# Patient Record
Sex: Male | Born: 1955 | Race: White | Hispanic: No | Marital: Married | Smoking: Never smoker
Health system: Southern US, Community
[De-identification: ages and names within clinical notes are randomized; demographics above are authoritative.]

## PROBLEM LIST (undated history)

## (undated) DIAGNOSIS — Z973 Presence of spectacles and contact lenses: Secondary | ICD-10-CM

## (undated) DIAGNOSIS — K219 Gastro-esophageal reflux disease without esophagitis: Secondary | ICD-10-CM

## (undated) DIAGNOSIS — G8929 Other chronic pain: Secondary | ICD-10-CM

## (undated) DIAGNOSIS — Z8659 Personal history of other mental and behavioral disorders: Secondary | ICD-10-CM

## (undated) DIAGNOSIS — M545 Low back pain, unspecified: Secondary | ICD-10-CM

## (undated) DIAGNOSIS — G43909 Migraine, unspecified, not intractable, without status migrainosus: Secondary | ICD-10-CM

## (undated) DIAGNOSIS — E785 Hyperlipidemia, unspecified: Secondary | ICD-10-CM

## (undated) DIAGNOSIS — Z8619 Personal history of other infectious and parasitic diseases: Secondary | ICD-10-CM

## (undated) HISTORY — DX: Hyperlipidemia, unspecified: E78.5

## (undated) HISTORY — DX: Low back pain, unspecified: M54.50

## (undated) HISTORY — DX: Other chronic pain: G89.29

## (undated) HISTORY — DX: Gastro-esophageal reflux disease without esophagitis: K21.9

## (undated) HISTORY — PX: WISDOM TOOTH EXTRACTION: SHX21

## (undated) HISTORY — PX: BRAIN SURGERY: SHX531

## (undated) HISTORY — DX: Low back pain: M54.5

## (undated) HISTORY — DX: Migraine, unspecified, not intractable, without status migrainosus: G43.909

## (undated) HISTORY — DX: Personal history of other mental and behavioral disorders: Z86.59

## (undated) HISTORY — DX: Personal history of other infectious and parasitic diseases: Z86.19

---
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
## 2012-10-30 ENCOUNTER — Ambulatory Visit (INDEPENDENT_AMBULATORY_CARE_PROVIDER_SITE_OTHER): Payer: 59 | Admitting: Family Medicine

## 2012-10-30 ENCOUNTER — Encounter: Payer: Self-pay | Admitting: Family Medicine

## 2012-10-30 VITALS — BP 130/82 | HR 62 | Temp 98.3°F | Ht 75.0 in | Wt 218.0 lb

## 2012-10-30 DIAGNOSIS — E785 Hyperlipidemia, unspecified: Secondary | ICD-10-CM | POA: Insufficient documentation

## 2012-10-30 DIAGNOSIS — G43909 Migraine, unspecified, not intractable, without status migrainosus: Secondary | ICD-10-CM | POA: Insufficient documentation

## 2012-10-30 DIAGNOSIS — K4090 Unilateral inguinal hernia, without obstruction or gangrene, not specified as recurrent: Secondary | ICD-10-CM

## 2012-10-30 DIAGNOSIS — K219 Gastro-esophageal reflux disease without esophagitis: Secondary | ICD-10-CM | POA: Insufficient documentation

## 2012-10-30 DIAGNOSIS — M545 Low back pain: Secondary | ICD-10-CM

## 2012-10-30 DIAGNOSIS — H8110 Benign paroxysmal vertigo, unspecified ear: Secondary | ICD-10-CM

## 2012-10-30 DIAGNOSIS — G8929 Other chronic pain: Secondary | ICD-10-CM

## 2012-10-30 HISTORY — DX: Unilateral inguinal hernia, without obstruction or gangrene, not specified as recurrent: K40.90

## 2012-10-30 MED ORDER — TRAMADOL HCL 50 MG PO TABS: 50.0000 mg | ORAL_TABLET | Freq: Two times a day (BID) | ORAL | Status: DC | PRN

## 2012-10-30 MED ORDER — MECLIZINE HCL 25 MG PO TABS: 25.0000 mg | ORAL_TABLET | Freq: Two times a day (BID) | ORAL | Status: DC | PRN

## 2012-10-30 NOTE — Assessment & Plan Note (Signed)
Anticipate lumbar strain vs lumbar DDD. Await records of xray from prior PCP. Treat pain with tramadol for now. No red flags today.

## 2012-10-30 NOTE — Assessment & Plan Note (Signed)
Anticipate bilateral BPPV.   Consistent exam today with peripheral cause of vertigo - will treat with home modified epley's and meclizine prn vertigo. To update [place] if not improving with this for referal to vestibular training. Pt agrees with plan. No evidence for central cause today.

## 2012-10-30 NOTE — Assessment & Plan Note (Signed)
Refer to surgery for L inguinal hernia noted today.  Reducible today.

## 2012-10-30 NOTE — Progress Notes (Signed)
Subjective:    Patient ID: Brian Vaughan, male    DOB: 1955-08-21, 57 y.o.   MRN: [id number]  HPI CC: new pt to establish  Prior saw Dr. Dewaine Oats in [place].  DOI 10/23/2012 Works as Psychologist, occupational.  Moving argon bottle - heavy bottle - felt tear in groin - noticed knot that is reducible.  Tender.  Next day was fired from work after he notified them he injured himself.  Already has new job.  Chronic lumbar back pain - thinks from chronic bending over at work.  Has had xrays in past as well as tried taking vicodin 10mg  in past.  Not taking any meds for this.  Worsening over last few weeks.  No shooting pain down legs, paresthesias or weakness of legs, fevers, denies inciting trauma/injury.  Increased dizziness after hot weather week.  Thinks may have overheated.  Not feeling right since.  Sluggish, confused, dizzy.  Mainly vertigo sxs.  Sxs happen with sudden head movements.  Imbalance noted with walking.  No presyncope.  No preceding viral illness.  Denies hearing changes or ringing in ears.  No fevers.  Wants spots on side evaluated.   Lives with wife, no pets Occupation: Psychologist, occupational, Chief Technology Officer at night Edu: HS Activity: no regular exercise  Preventative:  No recent CPE No recent blood work.  Medications and allergies reviewed and updated in chart.  Past histories reviewed and updated if relevant as below. Patient Active Problem List   Diagnosis Date Noted  . Migraine   . HLD (hyperlipidemia)   . GERD (gastroesophageal reflux disease)   . Chronic lumbar pain    Past Medical History  Diagnosis Date  . Chronic lumbar pain   . History of depression   . History of chicken pox   . GERD (gastroesophageal reflux disease)   . HLD (hyperlipidemia)   . Migraine    History reviewed. No pertinent past surgical history. History  Substance Use Topics  . Smoking status: Never Smoker   . Smokeless tobacco: Never Used  . Alcohol Use: Yes     Comment: seldom   Family History   Problem Relation Age of Onset  . Arthritis Mother   . Arthritis Father   . Alcohol abuse Father   . Arthritis Maternal Grandmother   . CAD Maternal Grandmother   . Arthritis Maternal Grandfather   . CAD Maternal Grandfather   . CAD Father 58    smoker, drinker  . Heart disease Mother     CHF  . Stroke Neg Hx   . Cancer Neg Hx   . Diabetes Neg Hx    Allergies  Allergen Reactions  . Codeine Nausea Only   No current outpatient prescriptions on file prior to visit.   No current facility-administered medications on file prior to visit.     Review of Systems  Constitutional: Positive for appetite change. Negative for fever, chills, activity change, fatigue and unexpected weight change.  HENT: Negative for hearing loss and neck pain.   Eyes: Positive for visual disturbance.  Respiratory: Negative for cough, chest tightness and wheezing.   Cardiovascular: Negative for chest pain, palpitations and leg swelling.  Gastrointestinal: Positive for diarrhea. Negative for nausea, vomiting, abdominal pain, constipation, blood in stool and abdominal distention.  Genitourinary: Negative for dysuria, urgency, hematuria and difficulty urinating.  Musculoskeletal: Negative for myalgias and arthralgias.  Skin: Negative for rash.  Neurological: Positive for headaches. Negative for dizziness, seizures and syncope.  Hematological: Negative for adenopathy. Does not bruise/bleed  easily.  Psychiatric/Behavioral: Negative for dysphoric mood. The patient is not nervous/anxious.        Objective:   Physical Exam  Nursing note and vitals reviewed. Constitutional: He is oriented to person, place, and time. He appears well-developed and well-nourished. No distress.  HENT:  Head: Normocephalic and atraumatic.  Right Ear: External ear normal.  Left Ear: External ear normal.  Nose: Nose normal.  Mouth/Throat: Oropharynx is clear and moist. No oropharyngeal exudate.  Eyes: Conjunctivae and EOM are  normal. Pupils are equal, round, and reactive to light. No scleral icterus.  Neck: Normal range of motion. Neck supple. No thyromegaly present.  Cardiovascular: Normal rate, regular rhythm, normal heart sounds and intact distal pulses.   No murmur heard. Pulses:      Radial pulses are 2+ on the right side, and 2+ on the left side.  Pulmonary/Chest: Effort normal and breath sounds normal. No respiratory distress. He has no wheezes. He has no rales.  Abdominal: Soft. Bowel sounds are normal. He exhibits no distension and no mass. There is no tenderness. There is no rebound and no guarding. A hernia is present. Hernia confirmed positive in the left inguinal area (tender but reducible). Hernia confirmed negative in the right inguinal area.  Musculoskeletal: Normal range of motion. He exhibits no edema.  Upper lumbar midline spine tenderness, paraspinous mm tightness L >R Neg SLR bilaterally. + faber bilaterally. Able to heel and toe walk.  Lymphadenopathy:    He has no cervical adenopathy.  Neurological: He is alert and oriented to person, place, and time. He has normal strength. No cranial nerve deficit or sensory deficit.  CN 2-12 intact, station and gait intact dix hallpike positive bilaterally with torsional nystagmus Able to heel and toe walk. 5/5 strength BLE  Skin: Skin is warm and dry. No rash noted.  mjultiple SK and moles L lateral trunk  Psychiatric: He has a normal mood and affect. His behavior is normal. Judgment and thought content normal.       Assessment & Plan:

## 2012-10-30 NOTE — Assessment & Plan Note (Signed)
Check FLP when returns fasting. 

## 2012-10-30 NOTE — Patient Instructions (Signed)
Good to meet you today.  Call us with questions. Pass by Marion's office for referral to surgeon. For vertigo - may use meclizine as needed, and do home exercises provided today. Return at your convenience in next few months fasting for blood work and afterwards for physical.

## 2012-10-30 NOTE — Assessment & Plan Note (Signed)
Stable currently - watching diet. Avoid NSAIDs for now.

## 2012-11-12 ENCOUNTER — Telehealth: Payer: Self-pay

## 2012-11-12 NOTE — Telephone Encounter (Addendum)
I suggest he increase to 2 pills of tramadol up to twice daily for pain as needed.  I will refill if needed.  He has endorsed nausea with vicodin/hydrocodone in the past. Needs to drop off forms for worker's comp. I still have not received records from prior PCP on latest xrays. If persistent pain, I will want him to come in for further eval and xrays.

## 2012-11-12 NOTE — Telephone Encounter (Signed)
Patient notified as instructed by telephone. Patient states that the Tramadol does nothing for him and that he know that there is something stronger out there that he can take. Patient states that he wants to go ahead and have new x-rays and further evaluation done. Appointment scheduled with Dr. Sharen Hones tomorrow (11-13-12).

## 2012-11-12 NOTE — Telephone Encounter (Signed)
Tramadol is not helping pt's back pain;pt has to work and cannot tolerate pain; pain level now is 8-9. Pt request different pain med called to [facility] [address]. Pt has worker's comp paperwork that needs to be filled out re; hernia. Pt has not made appt with surgeon; pt spoke with Shirlee Limerick. Pt request cb when pain med called in.

## 2012-11-13 ENCOUNTER — Ambulatory Visit: Payer: 59 | Admitting: Family Medicine

## 2012-12-11 ENCOUNTER — Encounter: Payer: Self-pay | Admitting: Family Medicine

## 2012-12-21 ENCOUNTER — Ambulatory Visit: Payer: Self-pay | Admitting: Family Medicine

## 2013-02-28 ENCOUNTER — Telehealth: Payer: Self-pay | Admitting: Family Medicine

## 2013-02-28 NOTE — Telephone Encounter (Signed)
Brian Vaughan wanted to let us know that he has hired a Paediatric nurse that will be helping him get his medical bills paid for and we should be getting a request for records soon.  I told him I would note this in his chart.

## 2013-03-26 ENCOUNTER — Telehealth: Payer: Self-pay

## 2013-03-26 DIAGNOSIS — K4090 Unilateral inguinal hernia, without obstruction or gangrene, not specified as recurrent: Secondary | ICD-10-CM

## 2013-03-26 NOTE — Telephone Encounter (Signed)
Consult made with Dr Derrell Lolling on 04/08/13 at 4pm, patient notified. MK

## 2013-03-26 NOTE — Telephone Encounter (Signed)
Pt left v/m requesting appt to be scheduled for hernia surgery in Jan 2015 in [place]. Pt was seen 10/30/12 and Dr Sharen Hones did surgical referral but pt had no insurance and pt is hurting and has insurance now and wants to get surgical referral scheduled.

## 2013-03-26 NOTE — Telephone Encounter (Signed)
Referral placed.

## 2013-04-08 ENCOUNTER — Encounter (INDEPENDENT_AMBULATORY_CARE_PROVIDER_SITE_OTHER): Payer: Self-pay | Admitting: General Surgery

## 2013-04-08 ENCOUNTER — Encounter (INDEPENDENT_AMBULATORY_CARE_PROVIDER_SITE_OTHER): Payer: Self-pay

## 2013-04-08 ENCOUNTER — Ambulatory Visit (INDEPENDENT_AMBULATORY_CARE_PROVIDER_SITE_OTHER): Payer: PRIVATE HEALTH INSURANCE | Admitting: General Surgery

## 2013-04-08 VITALS — BP 118/78 | HR 68 | Temp 97.4°F | Resp 16 | Ht 75.0 in | Wt 220.6 lb

## 2013-04-08 DIAGNOSIS — K4090 Unilateral inguinal hernia, without obstruction or gangrene, not specified as recurrent: Secondary | ICD-10-CM

## 2013-04-08 NOTE — Patient Instructions (Signed)
You have a reducible left inguinal hernia that is causing your pain. We did not find any other hernias elsewhere.  You'll be scheduled for open repair of left inguinal hernia with mesh in the near future at your convenience.  You have been given a prescription for Vicodin for pain. Only take it at night when you are home for the night. Do not take that medication while you are working and do not operate any machinery when you're taking this medication.      Inguinal Hernia, Adult  Care After Refer to this sheet in the next few weeks. These discharge instructions provide you with general information on caring for yourself after you leave the hospital. Your caregiver may also give you specific instructions. Your treatment has been planned according to the most current medical practices available, but unavoidable complications sometimes occur. If you have any problems or questions after discharge, please call your caregiver. HOME CARE INSTRUCTIONS  Put ice on the operative site.  Put ice in a plastic bag.  Place a towel between your skin and the bag.  Leave the ice on for 15-20 minutes at a time, 03-04 times a day while awake.  Change bandages (dressings) as directed.  Keep the wound dry and clean. The wound may be washed gently with soap and water. Gently blot or dab the wound dry. It is okay to take showers 24 to 48 hours after surgery. Do not take baths, use swimming pools, or use hot tubs for 10 days, or as directed by your caregiver.  Only take over-the-counter or prescription medicines for pain, discomfort, or fever as directed by your caregiver.  Continue your normal diet as directed.  Do not lift anything more than 10 pounds or play contact sports for 3 weeks, or as directed. SEEK MEDICAL CARE IF:  There is redness, swelling, or increasing pain in the wound.  There is fluid (pus) coming from the wound.  There is drainage from a wound lasting longer than 1 day.  You have  an oral temperature above 102 F (38.9 C).  You notice a bad smell coming from the wound or dressing.  The wound breaks open after the stitches (sutures) have been removed.  You notice increasing pain in the shoulders (shoulder strap areas).  You develop dizzy episodes or fainting while standing.  You feel sick to your stomach (nauseous) or throw up (vomit). SEEK IMMEDIATE MEDICAL CARE IF:  You develop a rash.  You have difficulty breathing.  You develop a reaction or have side effects to medicines you were given. MAKE SURE YOU:   Understand these instructions.  Will watch your condition.  Will get help right away if you are not doing well or get worse. Document Released: 05/26/2006 Document Revised: 07/18/2011 Document Reviewed: 03/25/2009 [facility] Patient Information 2014 [place], [place]. Inguinal Hernia, Adult Muscles help keep everything in the body in its proper place. But if a weak spot in the muscles develops, something can poke through. That is called a hernia. When this happens in the lower part of the belly (abdomen), it is called an inguinal hernia. (It takes its name from a part of the body in this region called the inguinal canal.) A weak spot in the wall of muscles lets some fat or part of the small intestine bulge through. An inguinal hernia can develop at any age. Men get them more often than women. CAUSES  In adults, an inguinal hernia develops over time.  It can be triggered by:  Suddenly straining the muscles of the lower abdomen.  Lifting heavy objects.  Straining to have a bowel movement. Difficult bowel movements (constipation) can lead to this.  Constant coughing. This may be caused by smoking or lung disease.  Being overweight.  Being pregnant.  Working at a job that requires long periods of standing or heavy lifting.  Having had an inguinal hernia before. One type can be an emergency situation. It is called a strangulated inguinal hernia. It  develops if part of the small intestine slips through the weak spot and cannot get back into the abdomen. The blood supply can be cut off. If that happens, part of the intestine may die. This situation requires emergency surgery. SYMPTOMS  Often, a small inguinal hernia has no symptoms. It is found when a healthcare provider does a physical exam. Larger hernias usually have symptoms.   In adults, symptoms may include:  A lump in the groin. This is easier to see when the person is standing. It might disappear when lying down.  In men, a lump in the scrotum.  Pain or burning in the groin. This occurs especially when lifting, straining or coughing.  A dull ache or feeling of pressure in the groin.  Signs of a strangulated hernia can include:  A bulge in the groin that becomes very painful and tender to the touch.  A bulge that turns red or purple.  Fever, nausea and vomiting.  Inability to have a bowel movement or to pass gas. DIAGNOSIS  To decide if you have an inguinal hernia, a healthcare provider will probably do a physical examination.  This will include asking questions about any symptoms you have noticed.  The healthcare provider might feel the groin area and ask you to cough. If an inguinal hernia is felt, the healthcare provider may try to slide it back into the abdomen.  Usually no other tests are needed. TREATMENT  Treatments can vary. The size of the hernia makes a difference. Options include:  Watchful waiting. This is often suggested if the hernia is small and you have had no symptoms.  No medical procedure will be done unless symptoms develop.  You will need to watch closely for symptoms. If any occur, contact your healthcare provider right away.  Surgery. This is used if the hernia is larger or you have symptoms.  Open surgery. This is usually an outpatient procedure (you will not stay overnight in a hospital). An cut (incision) is made through the skin in the  groin. The hernia is put back inside the abdomen. The weak area in the muscles is then repaired by herniorrhaphy or hernioplasty. Herniorrhaphy: in this type of surgery, the weak muscles are sewn back together. Hernioplasty: a patch or mesh is used to close the weak area in the abdominal wall.  Laparoscopy. In this procedure, a surgeon makes small incisions. A thin tube with a tiny video camera (called a laparoscope) is put into the abdomen. The surgeon repairs the hernia with mesh by looking with the video camera and using two long instruments. HOME CARE INSTRUCTIONS   After surgery to repair an inguinal hernia:  You will need to take pain medicine prescribed by your healthcare provider. Follow all directions carefully.  You will need to take care of the wound from the incision.  Your activity will be restricted for awhile. This will probably include no heavy lifting for several weeks. You also should not do anything too active for a few weeks. When you  can return to work will depend on the type of job that you have.  During "watchful waiting" periods, you should:  Maintain a healthy weight.  Eat a diet high in fiber (fruits, vegetables and whole grains).  Drink plenty of fluids to avoid constipation. This means drinking enough water and other liquids to keep your urine clear or pale yellow.  Do not lift heavy objects.  Do not stand for long periods of time.  Quit smoking. This should keep you from developing a frequent cough. SEEK MEDICAL CARE IF:   A bulge develops in your groin area.  You feel pain, a burning sensation or pressure in the groin. This might be worse if you are lifting or straining.  You develop a fever of more than 100.5 F (38.1 C). SEEK IMMEDIATE MEDICAL CARE IF:   Pain in the groin increases suddenly.  A bulge in the groin gets bigger suddenly and does not go down.  For men, there is sudden pain in the scrotum. Or, the size of the scrotum increases.  A  bulge in the groin area becomes red or purple and is painful to touch.  You have nausea or vomiting that does not go away.  You feel your heart beating much faster than normal.  You cannot have a bowel movement or pass gas.  You develop a fever of more than 102.0 F (38.9 C). Document Released: 09/11/2008 Document Revised: 07/18/2011 Document Reviewed: 09/11/2008 [facility] Patient Information 2014 [place], [place].

## 2013-04-08 NOTE — Progress Notes (Addendum)
Patient ID: Brian Vaughan, male   DOB: 13-Aug-1955, 56 y.o.   MRN: [id number]  Chief Complaint  Patient presents with  . Pain    evaluate inguinal hernia    HPI Brian Vaughan is a 57 y.o. male.  He is referred by Dr. Sharen Hones at [facility] for evaluation of a painful left inguinal hernia.  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 He states that in June of 2014, while at work he developed a pain in his left groin and has had a bulge ever since. He states he reported this to his employer. He states the painful bulge is getting worse and he needs to have this repaired. He also asked for prescription for pain medication.  ADDENDUM(06/04/2013) The patient called and stated that I had made an error in his medical records. He stated that the accident occurred at work in June of 2014, not July of 2013.  At his request, I have ammended my records to reflect the updated history.  Past history is significant for mild reflux symptoms, chronic back pain, the positional vertigo and hyperlipidemia. He has never had a hernia problem in the past.  HPI  Past Medical History  Diagnosis Date  . Chronic lumbar pain     lumbar DDD per xrays  . History of depression   . History of chicken pox   . GERD (gastroesophageal reflux disease)   . HLD (hyperlipidemia)   . Migraine     Past Surgical History  Procedure Laterality Date  . Hernia repair Right 1969    Family History  Problem Relation Age of Onset  . Arthritis Mother   . Arthritis Father   . Alcohol abuse Father   . Arthritis Maternal Grandmother   . CAD Maternal Grandmother   . Arthritis Maternal Grandfather   . CAD Maternal Grandfather   . CAD Father 59    smoker, drinker  . Heart disease Mother     CHF  . Stroke Neg Hx   . Cancer Neg Hx   . Diabetes Neg Hx     Social History History  Substance Use Topics  . Smoking status: Never Smoker   . Smokeless tobacco: Never Used  . Alcohol Use: Yes      Comment: seldom    Allergies  Allergen Reactions  . Codeine Nausea Only  . Vicodin [Hydrocodone-Acetaminophen] Nausea Only    Current Outpatient Prescriptions  Medication Sig Dispense Refill  . Multiple Vitamins-Minerals (MULTIVITAMIN PO) Take 1 tablet by mouth daily.       No current facility-administered medications for this visit.    Review of Systems Review of Systems  Constitutional: Negative for fever, chills and unexpected weight change.  HENT: Negative for congestion, hearing loss, sore throat, trouble swallowing and voice change.   Eyes: Negative for visual disturbance.  Respiratory: Negative for cough and wheezing.   Cardiovascular: Negative for chest pain, palpitations and leg swelling.  Gastrointestinal: Negative for nausea, vomiting, abdominal pain, diarrhea, constipation, blood in stool, abdominal distention, anal bleeding and rectal pain.  Genitourinary: Negative for hematuria and difficulty urinating.  Musculoskeletal: Positive for back pain. Negative for arthralgias.  Skin: Negative for rash and wound.  Neurological: Positive for light-headedness and headaches. Negative for seizures, syncope and weakness.  Hematological: Negative for adenopathy. Does not bruise/bleed easily.  Psychiatric/Behavioral: Negative for confusion.    Blood pressure 118/78, pulse 68, temperature 97.4 F (36.3 C), temperature source Temporal, resp. rate 16, height  6\' 3"  (1.905 m), weight 220 lb 9.6 oz (100.064 kg).  Physical Exam Physical Exam  Constitutional: He is oriented to person, place, and time. He appears well-developed and well-nourished. No distress.  HENT:  Head: Normocephalic.  Nose: Nose normal.  Mouth/Throat: No oropharyngeal exudate.  Eyes: Conjunctivae and EOM are normal. Pupils are equal, round, and reactive to light. Right eye exhibits no discharge. Left eye exhibits no discharge. No scleral icterus.  Neck: Normal range of motion. Neck supple. No JVD present. No  tracheal deviation present. No thyromegaly present.  Cardiovascular: Normal rate, regular rhythm, normal heart sounds and intact distal pulses.   No murmur heard. Pulmonary/Chest: Effort normal and breath sounds normal. No stridor. No respiratory distress. He has no wheezes. He has no rales. He exhibits no tenderness.  Abdominal: Soft. Bowel sounds are normal. He exhibits no distension and no mass. There is no tenderness. There is no rebound and no guarding.  Genitourinary:  Medium-sized left inguinal hernia. Tender. Reducible. No evidence of hernia on the right. Umbilicus normal. No scrotal mass. Testes normal.  Musculoskeletal: Normal range of motion. He exhibits no edema and no tenderness.  Lymphadenopathy:    He has no cervical adenopathy.  Neurological: He is alert and oriented to person, place, and time. He has normal reflexes. Coordination normal.  Skin: Skin is warm and dry. No rash noted. He is not diaphoretic. No erythema. No pallor.  Psychiatric: He has a normal mood and affect. His behavior is normal. Judgment and thought content normal.    Data Reviewed Office notes from Dr. Sharen Hones at [facility].  Assessment    Reducible left inguinal hernia, symptomatic  Migraine headaches  Positional vertigo  GERD  Chronic back pain  Hyperlipidemia     Plan    Scheduled for open repair left inguinal hernia with mesh. This can be done as an outpatient.  I have discussed indications, details, techniques, numerous risk of the surgery with him. He is aware of the risk of bleeding, infection, recurrence, nerve damage, chronic pain, injury to the bladder or intestine or testicle, and other unforeseen problems. He understands all these issues and all his questions were answered. He agrees with this plan.  He is aware that he will be out of work for 4 weeks while while recovering from this surgery.  I gave him a Rx for Vicodin 30 tablets and instructed him to take his only at night  when he is home.        Angelia Mould. Derrell Lolling, M.D., [facility] General and Minimally invasive Surgery Breast and Colorectal Surgery Office:   [phone] Pager:   [phone]  04/08/2013, 4:52 PM

## 2013-04-26 ENCOUNTER — Telehealth (INDEPENDENT_AMBULATORY_CARE_PROVIDER_SITE_OTHER): Payer: Self-pay | Admitting: *Deleted

## 2013-04-26 MED ORDER — IBUPROFEN 800 MG PO TABS: 800.0000 mg | ORAL_TABLET | Freq: Three times a day (TID) | ORAL | Status: DC | PRN

## 2013-04-26 NOTE — Telephone Encounter (Signed)
Patient called today asking for a refill of Vicodin.  Patient states his surgery is not until 05/17/13 left inguinal hernia repair.  Spoke to Dr. Derrell Lolling in office who states that we can send in Ibuprofen 800mg  #30 for the patient but he will not prescribe any more narcotics prior to surgery.  Prescription escribed at this time to [facility] in [place].  Patient updated at this time and states understanding.

## 2013-05-09 HISTORY — PX: HERNIA REPAIR: SHX51

## 2013-05-14 ENCOUNTER — Encounter (HOSPITAL_BASED_OUTPATIENT_CLINIC_OR_DEPARTMENT_OTHER): Payer: Self-pay | Admitting: *Deleted

## 2013-05-14 NOTE — Progress Notes (Signed)
No labs needed

## 2013-05-15 NOTE — H&P (Signed)
Brian Vaughan   MRN:  [id number]   Description: 58 year old male  Provider: Ernestene Mention, MD  Department: [facility]         Diagnoses      Left inguinal hernia    -  Primary      550.90            Current Vitals - Last Recorded      BP Pulse Temp(Src) Resp Ht Wt      118/78 68 97.4 F (36.3 C) (Temporal) 16 6\' 3"  (1.905 m) 220 lb 9.6 oz (100.064 kg)      BMI 27.57 kg/m2                     History and Physical   Ernestene Mention, MD       Status: Signed            Patient ID: Brian Vaughan, male   DOB: Oct 06, 1955, 58 y.o.   MRN: [id number]                HPI Brian Vaughan is a 58 y.o. male.  He is referred by Dr. Sharen Hones at [facility] for evaluation of a painful left inguinal hernia.   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 He states that in July of 2013 while at work he developed a pain in his left groin and has had a bulge ever since. He states he reported this to his employer. He states the painful bulge is getting worse and he needs to have this repaired. He also asked for prescription for pain medication.   Past history is significant for mild reflux symptoms, chronic back pain, the positional vertigo and hyperlipidemia. He has never had a hernia problem in the past.        Past Medical History   Diagnosis  Date   .  Chronic lumbar pain         lumbar DDD per xrays   .  History of depression     .  History of chicken pox     .  GERD (gastroesophageal reflux disease)     .  HLD (hyperlipidemia)     .  Migraine           Past Surgical History   Procedure  Laterality  Date   .  Hernia repair  Right  1969         Family History   Problem  Relation  Age of Onset   .  Arthritis  Mother     .  Arthritis  Father     .  Alcohol abuse  Father     .  Arthritis  Maternal Grandmother     .  CAD  Maternal Grandmother     .  Arthritis  Maternal Grandfather     .  CAD  Maternal Grandfather      .  CAD  Father  88       smoker, drinker   .  Heart disease  Mother         CHF   .  Stroke  Neg Hx     .  Cancer  Neg Hx     .  Diabetes  Neg Hx          Social History History   Substance Use Topics   .  Smoking status:  Never Smoker    .  Smokeless tobacco:  Never Used   .  Alcohol Use:  Yes         Comment: seldom         Allergies   Allergen  Reactions   .  Codeine  Nausea Only   .  Vicodin [Hydrocodone-Acetaminophen]  Nausea Only         Current Outpatient Prescriptions   Medication  Sig  Dispense  Refill   .  Multiple Vitamins-Minerals (MULTIVITAMIN PO)  Take 1 tablet by mouth daily.             No current facility-administered medications for this visit.        Review of Systems   Constitutional: Negative for fever, chills and unexpected weight change.  HENT: Negative for congestion, hearing loss, sore throat, trouble swallowing and voice change.   Eyes: Negative for visual disturbance.  Respiratory: Negative for cough and wheezing.   Cardiovascular: Negative for chest pain, palpitations and leg swelling.  Gastrointestinal: Negative for nausea, vomiting, abdominal pain, diarrhea, constipation, blood in stool, abdominal distention, anal bleeding and rectal pain.  Genitourinary: Negative for hematuria and difficulty urinating.  Musculoskeletal: Positive for back pain. Negative for arthralgias.  Skin: Negative for rash and wound.  Neurological: Positive for light-headedness and headaches. Negative for seizures, syncope and weakness.  Hematological: Negative for adenopathy. Does not bruise/bleed easily.  Psychiatric/Behavioral: Negative for confusion.      Blood pressure 118/78, pulse 68, temperature 97.4 F (36.3 C), temperature source Temporal, resp. rate 16, height 6\' 3"  (1.905 m), weight 220 lb 9.6 oz (100.064 kg).   Physical Exam  Constitutional: He is oriented to person, place, and time. He appears well-developed and well-nourished. No  distress.  HENT:   Head: Normocephalic.   Nose: Nose normal.   Mouth/Throat: No oropharyngeal exudate.  Eyes: Conjunctivae and EOM are normal. Pupils are equal, round, and reactive to light. Right eye exhibits no discharge. Left eye exhibits no discharge. No scleral icterus.  Neck: Normal range of motion. Neck supple. No JVD present. No tracheal deviation present. No thyromegaly present.  Cardiovascular: Normal rate, regular rhythm, normal heart sounds and intact distal pulses.    No murmur heard. Pulmonary/Chest: Effort normal and breath sounds normal. No stridor. No respiratory distress. He has no wheezes. He has no rales. He exhibits no tenderness.  Abdominal: Soft. Bowel sounds are normal. He exhibits no distension and no mass. There is no tenderness. There is no rebound and no guarding.  Genitourinary:  Medium-sized left inguinal hernia. Tender. Reducible. No evidence of hernia on the right. Umbilicus normal. No scrotal mass. Testes normal.  Musculoskeletal: Normal range of motion. He exhibits no edema and no tenderness.  Lymphadenopathy:    He has no cervical adenopathy.  Neurological: He is alert and oriented to person, place, and time. He has normal reflexes. Coordination normal.  Skin: Skin is warm and dry. No rash noted. He is not diaphoretic. No erythema. No pallor.  Psychiatric: He has a normal mood and affect. His behavior is normal. Judgment and thought content normal.      Data Reviewed Office notes from Dr. Sharen Hones at [facility].   Assessment    Reducible left inguinal hernia, symptomatic   Migraine headaches   Positional vertigo   GERD   Chronic back pain   Hyperlipidemia      Plan    Scheduled for open repair left inguinal hernia with mesh. This can be done as an outpatient.   I have discussed  indications, details, techniques, numerous risk of the surgery with him. He is aware of the risk of bleeding, infection, recurrence, nerve damage,  chronic pain, injury to the bladder or intestine or testicle, and other unforeseen problems. He understands all these issues and all his questions were answered. He agrees with this plan.   He is aware that he will be out of work for 4 weeks while while recovering from this surgery.   I gave him a Rx for Vicodin 30 tablets and instructed him to take his only at night when he is home.         Angelia MouldHaywood M. Derrell LollingIngram, M.D., [facility] General and Minimally invasive Surgery Breast and Colorectal Surgery Office:   [phone] Pager:   [phone]

## 2013-05-17 ENCOUNTER — Encounter (HOSPITAL_BASED_OUTPATIENT_CLINIC_OR_DEPARTMENT_OTHER): Payer: Self-pay | Admitting: *Deleted

## 2013-05-17 ENCOUNTER — Encounter (HOSPITAL_BASED_OUTPATIENT_CLINIC_OR_DEPARTMENT_OTHER)
Admission: RE | Disposition: A | Discharge status: Home / Self Care | Payer: Self-pay | Source: Ambulatory Visit | Attending: General Surgery

## 2013-05-17 ENCOUNTER — Ambulatory Visit (HOSPITAL_BASED_OUTPATIENT_CLINIC_OR_DEPARTMENT_OTHER)
Admission: RE | Admit: 2013-05-17 | Discharge: 2013-05-17 | Disposition: A | Discharge status: Home / Self Care | Payer: Self-pay | Source: Ambulatory Visit | Attending: General Surgery | Admitting: General Surgery

## 2013-05-17 ENCOUNTER — Ambulatory Visit (HOSPITAL_BASED_OUTPATIENT_CLINIC_OR_DEPARTMENT_OTHER): Payer: Self-pay | Admitting: Certified Registered"

## 2013-05-17 ENCOUNTER — Encounter (HOSPITAL_BASED_OUTPATIENT_CLINIC_OR_DEPARTMENT_OTHER): Payer: Self-pay | Admitting: Certified Registered"

## 2013-05-17 DIAGNOSIS — K219 Gastro-esophageal reflux disease without esophagitis: Secondary | ICD-10-CM | POA: Insufficient documentation

## 2013-05-17 DIAGNOSIS — K4090 Unilateral inguinal hernia, without obstruction or gangrene, not specified as recurrent: Secondary | ICD-10-CM

## 2013-05-17 HISTORY — DX: Presence of spectacles and contact lenses: Z97.3

## 2013-05-17 HISTORY — PX: INGUINAL HERNIA REPAIR: SHX194

## 2013-05-17 HISTORY — PX: INSERTION OF MESH: SHX5868

## 2013-05-17 LAB — POCT HEMOGLOBIN-HEMACUE: HEMOGLOBIN: 10 g/dL — AB (ref 13.0–17.0)

## 2013-05-17 SURGERY — INSERTION OF MESH
Anesthesia: General | Site: Groin | Laterality: Left

## 2013-05-17 MED ORDER — MIDAZOLAM HCL 2 MG/2ML IJ SOLN
INTRAMUSCULAR | Status: AC
  Filled 2013-05-17: qty 2

## 2013-05-17 MED ORDER — PROPOFOL 10 MG/ML IV BOLUS
INTRAVENOUS | Status: DC | PRN
  Administered 2013-05-17: 100 mg via INTRAVENOUS
  Administered 2013-05-17: 200 mg via INTRAVENOUS

## 2013-05-17 MED ORDER — LIDOCAINE HCL (CARDIAC) 20 MG/ML IV SOLN
INTRAVENOUS | Status: DC | PRN
  Administered 2013-05-17: 60 mg via INTRAVENOUS

## 2013-05-17 MED ORDER — MIDAZOLAM HCL 5 MG/5ML IJ SOLN
INTRAMUSCULAR | Status: DC | PRN
  Administered 2013-05-17: 1 mg via INTRAVENOUS

## 2013-05-17 MED ORDER — CEFAZOLIN SODIUM-DEXTROSE 2-3 GM-% IV SOLR
2.0000 g | INTRAVENOUS | Status: AC
Start: 2013-05-17 — End: 2013-05-17
  Administered 2013-05-17: 2 g via INTRAVENOUS

## 2013-05-17 MED ORDER — HYDROMORPHONE HCL PF 1 MG/ML IJ SOLN
INTRAMUSCULAR | Status: AC
  Filled 2013-05-17: qty 1

## 2013-05-17 MED ORDER — NEOSTIGMINE METHYLSULFATE 1 MG/ML IJ SOLN
INTRAMUSCULAR | Status: DC | PRN
  Administered 2013-05-17: 3.5 mg via INTRAVENOUS

## 2013-05-17 MED ORDER — MEPERIDINE HCL 25 MG/ML IJ SOLN: 6.2500 mg | INTRAMUSCULAR | Status: DC | PRN

## 2013-05-17 MED ORDER — ONDANSETRON HCL 4 MG/2ML IJ SOLN: 4.0000 mg | Freq: Once | INTRAMUSCULAR | Status: DC | PRN

## 2013-05-17 MED ORDER — ROCURONIUM BROMIDE 100 MG/10ML IV SOLN
INTRAVENOUS | Status: DC | PRN
  Administered 2013-05-17: 40 mg via INTRAVENOUS

## 2013-05-17 MED ORDER — MIDAZOLAM HCL 2 MG/2ML IJ SOLN
1.0000 mg | INTRAMUSCULAR | Status: DC | PRN
  Administered 2013-05-17: 2 mg via INTRAVENOUS
  Administered 2013-05-17: 1 mg via INTRAVENOUS

## 2013-05-17 MED ORDER — PROPOFOL 10 MG/ML IV EMUL
INTRAVENOUS | Status: AC
  Filled 2013-05-17: qty 150

## 2013-05-17 MED ORDER — CHLORHEXIDINE GLUCONATE 4 % EX LIQD: 1.0000 "application " | Freq: Once | CUTANEOUS | Status: DC

## 2013-05-17 MED ORDER — OXYCODONE HCL 5 MG PO TABS
5.0000 mg | ORAL_TABLET | Freq: Once | ORAL | Status: AC | PRN
  Administered 2013-05-17: 5 mg via ORAL
  Filled 2013-05-17: qty 1

## 2013-05-17 MED ORDER — FENTANYL CITRATE 0.05 MG/ML IJ SOLN
INTRAMUSCULAR | Status: AC
  Filled 2013-05-17: qty 2

## 2013-05-17 MED ORDER — BUPIVACAINE-EPINEPHRINE PF 0.5-1:200000 % IJ SOLN
INTRAMUSCULAR | Status: AC
  Filled 2013-05-17: qty 30

## 2013-05-17 MED ORDER — LACTATED RINGERS IV SOLN
INTRAVENOUS | Status: DC
  Administered 2013-05-17: 12:00:00 via INTRAVENOUS
  Administered 2013-05-17: 20 mL/h via INTRAVENOUS

## 2013-05-17 MED ORDER — BUPIVACAINE-EPINEPHRINE PF 0.5-1:200000 % IJ SOLN
INTRAMUSCULAR | Status: DC | PRN
  Administered 2013-05-17: 10 mL via PERINEURAL

## 2013-05-17 MED ORDER — FENTANYL CITRATE 0.05 MG/ML IJ SOLN
INTRAMUSCULAR | Status: AC
  Filled 2013-05-17: qty 6

## 2013-05-17 MED ORDER — CEFAZOLIN SODIUM-DEXTROSE 2-3 GM-% IV SOLR
INTRAVENOUS | Status: AC
  Filled 2013-05-17: qty 50

## 2013-05-17 MED ORDER — HYDROMORPHONE HCL PF 1 MG/ML IJ SOLN
0.2500 mg | INTRAMUSCULAR | Status: DC | PRN
  Administered 2013-05-17: 0.25 mg via INTRAVENOUS
  Administered 2013-05-17: 0.5 mg via INTRAVENOUS
  Administered 2013-05-17: 0.25 mg via INTRAVENOUS

## 2013-05-17 MED ORDER — FENTANYL CITRATE 0.05 MG/ML IJ SOLN
50.0000 ug | INTRAMUSCULAR | Status: DC | PRN
  Administered 2013-05-17: 50 ug via INTRAVENOUS
  Administered 2013-05-17: 100 ug via INTRAVENOUS

## 2013-05-17 MED ORDER — DEXAMETHASONE SODIUM PHOSPHATE 4 MG/ML IJ SOLN
INTRAMUSCULAR | Status: DC | PRN
  Administered 2013-05-17: 10 mg via INTRAVENOUS

## 2013-05-17 MED ORDER — ONDANSETRON HCL 4 MG/2ML IJ SOLN
INTRAMUSCULAR | Status: DC | PRN
  Administered 2013-05-17: 4 mg via INTRAVENOUS

## 2013-05-17 MED ORDER — FENTANYL CITRATE 0.05 MG/ML IJ SOLN
INTRAMUSCULAR | Status: DC | PRN
  Administered 2013-05-17 (×2): 50 ug via INTRAVENOUS
  Administered 2013-05-17: 100 ug via INTRAVENOUS

## 2013-05-17 MED ORDER — MIDAZOLAM HCL 2 MG/2ML IJ SOLN
1.0000 mg | Freq: Once | INTRAMUSCULAR | Status: AC
  Administered 2013-05-17: 1 mg via INTRAVENOUS

## 2013-05-17 MED ORDER — OXYCODONE HCL 5 MG/5ML PO SOLN: 5.0000 mg | Freq: Once | ORAL | Status: AC | PRN

## 2013-05-17 MED ORDER — OXYCODONE-ACETAMINOPHEN 7.5-325 MG PO TABS: 1.0000 | ORAL_TABLET | ORAL | Status: DC | PRN

## 2013-05-17 MED ORDER — ROCURONIUM BROMIDE 50 MG/5ML IV SOLN
INTRAVENOUS | Status: AC
  Filled 2013-05-17: qty 1

## 2013-05-17 MED ORDER — GLYCOPYRROLATE 0.2 MG/ML IJ SOLN
INTRAMUSCULAR | Status: DC | PRN
  Administered 2013-05-17: .5 mg via INTRAVENOUS

## 2013-05-17 SURGICAL SUPPLY — 57 items
BENZOIN TINCTURE PRP APPL 2/3 (GAUZE/BANDAGES/DRESSINGS) IMPLANT
BLADE HEX COATED 2.75 (ELECTRODE) ×4 IMPLANT
BLADE SURG 10 STRL SS (BLADE) ×4 IMPLANT
BLADE SURG ROTATE 9660 (MISCELLANEOUS) ×4 IMPLANT
CANISTER SUCT 1200ML W/VALVE (MISCELLANEOUS) ×4 IMPLANT
CHLORAPREP W/TINT 26ML (MISCELLANEOUS) ×4 IMPLANT
CLOSURE WOUND 1/2 X4 (GAUZE/BANDAGES/DRESSINGS)
COVER MAYO STAND STRL (DRAPES) ×4 IMPLANT
COVER TABLE BACK 60X90 (DRAPES) ×4 IMPLANT
DECANTER SPIKE VIAL GLASS SM (MISCELLANEOUS) IMPLANT
DERMABOND ADVANCED (GAUZE/BANDAGES/DRESSINGS) ×2
DERMABOND ADVANCED .7 DNX12 (GAUZE/BANDAGES/DRESSINGS) ×2 IMPLANT
DRAIN PENROSE 1/2X12 LTX STRL (WOUND CARE) ×4 IMPLANT
DRAPE LAPAROTOMY TRNSV 102X78 (DRAPE) IMPLANT
DRAPE PED LAPAROTOMY (DRAPES) ×4 IMPLANT
ELECT REM PT RETURN 9FT ADLT (ELECTROSURGICAL) ×4
ELECTRODE REM PT RTRN 9FT ADLT (ELECTROSURGICAL) ×2 IMPLANT
GLOVE BIO SURGEON STRL SZ7 (GLOVE) ×4 IMPLANT
GLOVE BIOGEL PI IND STRL 7.5 (GLOVE) ×2 IMPLANT
GLOVE BIOGEL PI INDICATOR 7.5 (GLOVE) ×2
GLOVE EUDERMIC 7 POWDERFREE (GLOVE) ×4 IMPLANT
GOWN STRL REUS W/ TWL LRG LVL3 (GOWN DISPOSABLE) ×4 IMPLANT
GOWN STRL REUS W/ TWL XL LVL3 (GOWN DISPOSABLE) ×2 IMPLANT
GOWN STRL REUS W/TWL LRG LVL3 (GOWN DISPOSABLE) ×4
GOWN STRL REUS W/TWL XL LVL3 (GOWN DISPOSABLE) ×2
MESH ULTRAPRO 3X6 7.6X15CM (Mesh General) ×4 IMPLANT
NEEDLE HYPO 25X1 1.5 SAFETY (NEEDLE) ×4 IMPLANT
NS IRRIG 1000ML POUR BTL (IV SOLUTION) ×4 IMPLANT
PACK BASIN DAY SURGERY FS (CUSTOM PROCEDURE TRAY) ×4 IMPLANT
PENCIL BUTTON HOLSTER BLD 10FT (ELECTRODE) ×4 IMPLANT
SLEEVE SCD COMPRESS KNEE MED (MISCELLANEOUS) ×4 IMPLANT
SPONGE GAUZE 4X4 12PLY STER LF (GAUZE/BANDAGES/DRESSINGS) IMPLANT
SPONGE LAP 4X18 X RAY DECT (DISPOSABLE) ×4 IMPLANT
STAPLER VISISTAT 35W (STAPLE) IMPLANT
STRIP CLOSURE SKIN 1/2X4 (GAUZE/BANDAGES/DRESSINGS) IMPLANT
SUT MNCRL AB 4-0 PS2 18 (SUTURE) ×8 IMPLANT
SUT PROLENE 1 CT (SUTURE) IMPLANT
SUT PROLENE 2 0 CT2 30 (SUTURE) ×12 IMPLANT
SUT SILK 2 0 SH (SUTURE) ×4 IMPLANT
SUT SILK 2 0 TIES 17X18 (SUTURE) ×2
SUT SILK 2-0 18XBRD TIE BLK (SUTURE) ×2 IMPLANT
SUT SILK 3 0 SH 30 (SUTURE) IMPLANT
SUT VIC AB 2-0 CT1 27 (SUTURE)
SUT VIC AB 2-0 CT1 TAPERPNT 27 (SUTURE) IMPLANT
SUT VIC AB 2-0 SH 27 (SUTURE) ×4
SUT VIC AB 2-0 SH 27XBRD (SUTURE) ×4 IMPLANT
SUT VIC AB 3-0 54X BRD REEL (SUTURE) IMPLANT
SUT VIC AB 3-0 BRD 54 (SUTURE)
SUT VIC AB 3-0 FS2 27 (SUTURE) IMPLANT
SUT VIC AB 3-0 SH 27 (SUTURE) ×2
SUT VIC AB 3-0 SH 27X BRD (SUTURE) ×2 IMPLANT
SUT VICRYL 3-0 CR8 SH (SUTURE) IMPLANT
SYR CONTROL 10ML LL (SYRINGE) ×4 IMPLANT
TRAY DSU PREP LF (CUSTOM PROCEDURE TRAY) ×4 IMPLANT
TUBE CONNECTING 20'X1/4 (TUBING) ×1
TUBE CONNECTING 20X1/4 (TUBING) ×3 IMPLANT
YANKAUER SUCT BULB TIP NO VENT (SUCTIONS) ×4 IMPLANT

## 2013-05-17 NOTE — Progress Notes (Signed)
Dr. Michelle Piperssey notified that patient drank 2 cups of black coffe at 5a this morning.  Dr. Derrell LollingIngram also notified.

## 2013-05-17 NOTE — Transfer of Care (Signed)
Immediate Anesthesia Transfer of Care Note  Patient: Brian Vaughan  Procedure(s) Performed: Procedure(s): INSERTION OF MESH (Left) HERNIA REPAIR INGUINAL ADULT (Left)  Patient Location: PACU  Anesthesia Type:GA combined with regional for post-op pain  Level of Consciousness: awake, alert  and patient cooperative  Airway & Oxygen Therapy: Patient Spontanous Breathing and Patient connected to face mask oxygen  Post-op Assessment: Report given to PACU RN and Post -op Vital signs reviewed and stable  Post vital signs: Reviewed and stable  Complications: No apparent anesthesia complications

## 2013-05-17 NOTE — Anesthesia Preprocedure Evaluation (Addendum)
Anesthesia Evaluation  Patient identified by MRN, date of birth, ID band Patient awake    Reviewed: Allergy & Precautions, H&P , NPO status , Patient's Chart, lab work & pertinent test results  Airway Mallampati: I TM Distance: >3 FB Neck ROM: Full    Dental   Pulmonary          Cardiovascular     Neuro/Psych  Headaches,    GI/Hepatic   Endo/Other    Renal/GU      Musculoskeletal   Abdominal   Peds  Hematology   Anesthesia Other Findings   Reproductive/Obstetrics                          Anesthesia Physical Anesthesia Plan  ASA: II  Anesthesia Plan: General   Post-op Pain Management:    Induction: Intravenous  Airway Management Planned: LMA  Additional Equipment:   Intra-op Plan:   Post-operative Plan: Extubation in OR  Informed Consent: I have reviewed the patients History and Physical, chart, labs and discussed the procedure including the risks, benefits and alternatives for the proposed anesthesia with the patient or authorized representative who has indicated his/her understanding and acceptance.     Plan Discussed with: CRNA and Surgeon  Anesthesia Plan Comments: (Pt drank two large cups of black coffee at 5 am. Will need to wait 4hrs. Discussed with patient and Dr Derrell LollingIngram.)        Anesthesia Quick Evaluation

## 2013-05-17 NOTE — Discharge Instructions (Signed)
Post Anesthesia Home Care Instructions  Activity: Get plenty of rest for the remainder of the day. A responsible adult should stay with you for 24 hours following the procedure.  For the next 24 hours, DO NOT: -Drive a car -Advertising copywriterperate machinery -Drink alcoholic beverages -Take any medication unless instructed by your physician -Make any legal decisions or sign important papers.  Meals: Start with liquid foods such as gelatin or soup. Progress to regular foods as tolerated. Avoid greasy, spicy, heavy foods. If nausea and/or vomiting occur, drink only clear liquids until the nausea and/or vomiting subsides. Call your physician if vomiting continues.  Special Instructions/Symptoms: Your throat may feel dry or sore from the anesthesia or the breathing tube placed in your throat during surgery. If this causes discomfort, gargle with warm salt water. The discomfort should disappear within 24 hours.      Do not lift anything greater than 20 pounds and do not play sports for 1 complete month.  Be sure to walk daily  Take a laxative or stool softener to counteract constipation from the pain medicine  You may drive your car in one week.  You may shower starting tomorrow.       [facility]  UMBILICAL OR INGUINAL HERNIA REPAIR: POST OP INSTRUCTIONS  Always review your discharge instruction sheet given to you by the facility where your surgery was performed. IF YOU HAVE DISABILITY OR FAMILY LEAVE FORMS, YOU MUST BRING THEM TO THE OFFICE FOR PROCESSING.   DO NOT GIVE THEM TO YOUR DOCTOR.  1. A  prescription for pain medication may be given to you upon discharge.  Take your pain medication as prescribed, if needed.  If narcotic pain medicine is not needed, then you may take acetaminophen (Tylenol) or ibuprofen (Advil) as needed. 2. Take your usually prescribed medications unless otherwise directed. 3. If you need a refill on your pain medication, please  contact your pharmacy.  They will contact our office to request authorization. Prescriptions will not be filled after 5 pm or on week-ends. 4. You should follow a light diet the first 24 hours after arrival home, such as soup and crackers, etc.  Be sure to include lots of fluids daily.  Resume your normal diet the day after surgery. 5. Most patients will experience some swelling and bruising around the umbilicus or in the groin and scrotum.  Ice packs and reclining will help.  Swelling and bruising can take several days to resolve.  6. It is common to experience some constipation if taking pain medication after surgery.  Increasing fluid intake and taking a stool softener (such as Colace) will usually help or prevent this problem from occurring.  A mild laxative (Milk of Magnesia or Miralax) should be taken according to package directions if there are no bowel movements after 48 hours. 7. Unless discharge instructions indicate otherwise, you may remove your bandages 24-48 hours after surgery, and you may shower at that time.  You may have steri-strips (small skin tapes) in place directly over the incision.  These strips should be left on the skin for 7-10 days.  If your surgeon used skin glue on the incision, you may shower in 24 hours.  The glue will flake off over the next 2-3 weeks.  Any sutures or staples will be removed at the office during your follow-up visit. 8. ACTIVITIES:  You may resume regular (light) daily activities beginning the next day--such as daily self-care, walking, climbing stairs--gradually increasing activities as tolerated.  You may have sexual intercourse when it is comfortable.  Refrain from any heavy lifting or straining until approved by your doctor. a. You may drive when you are no longer taking prescription pain medication, you can comfortably wear a seatbelt, and you can safely maneuver your car and apply brakes. b. RETURN TO WORK:   __________________________________________________________ 9. You should see your doctor in the office for a follow-up appointment approximately 2-3 weeks after your surgery.  Make sure that you call for this appointment within a day or two after you arrive home to insure a convenient appointment time. 10. OTHER INSTRUCTIONS:  __________________________________________________________________________________________________________________________________________________________________________________________  WHEN TO CALL YOUR DOCTOR: 1. Fever over 101.0 2. Inability to urinate 3. Nausea and/or vomiting 4. Extreme swelling or bruising 5. Continued bleeding from incision. 6. Increased pain, redness, or drainage from the incision  The clinic staff is available to answer your questions during regular business hours.  Please dont hesitate to call and ask to speak to one of the nurses for clinical concerns.  If you have a medical emergency, go to the nearest emergency room or call 911.  A surgeon from [facility] is always on call at the hospital   [address], [address], [place], [place]  [address] ?  [address] [address], [place], [place]   [address] [phone] ? [phone] ? FAX [phone] Web site: [email or web address]

## 2013-05-17 NOTE — Interval H&P Note (Signed)
History and Physical Interval Note:  05/17/2013 7:59 AM  Brian Vaughan  has presented today for surgery, with the diagnosis of left inguinal hernia   The goals and the various methods of treatment have been discussed with the patient and family. After consideration of risks, benefits and other options for treatment, the patient has consented to  Procedure(s): LAPAROSCOPIC INGUINAL HERNIA (Left) INSERTION OF MESH (Left) as a surgical intervention .  The patient's history has been reviewed, patient examined today, no change in status, stable for surgery.  I have reviewed the patient's chart and labs.  Questions were answered to the patient's satisfaction.     Ernestene MentionINGRAM,Delylah Stanczyk M

## 2013-05-17 NOTE — Op Note (Addendum)
Patient Name:           Brian Vaughan   Date of Surgery:        05/17/2013  Pre op Diagnosis:      Left inguinal hernia  Post op Diagnosis:    Same  Procedure:                 Open repair of left inguinal hernia with mesh  Surgeon:                     Angelia MouldHaywood M. Derrell LollingIngram, M.D., FACS  Assistant:                      None  Operative Indications:   Brian Vaughan is a 58 y.o. male. He is referred by Dr. Sharen HonesGutierrez at [facility] for evaluation of a painful left inguinal hernia.  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 He states that in June of 2014, while at work he developed a pain in his left groin and has had a bulge ever since. He states he reported this to his employer. He states the painful bulge is getting worse and he needs to have this repaired. Examination reveals a medium size but reducible left inguinal hernia.. He has never had a hernia problem in the past   Operative Findings:       The patient had a medium-sized direct left inguinal hernia. He did not have an indirect hernia sac.  Procedure in Detail:          Following the induction of general endotracheal anesthesia the patient's abdomen and genitalia were prepped and draped in a sterile fashion. Surgical time out performed. Intravenous antibiotics were given. 0.5 to Marcaine with epinephrine was used as a local infiltration anesthetic. Dr. Michelle Piperssey had performed a TAPP Block preoperatively.    A transverse incision was made in the left groin, overlying the inguinal canal. Dissection was carried down to the inguinal soft tissue exposing the aponeurosis of the external oblique. The external oblique was incised in the direction of its fibers opening up the external inguinal ring. Self-retaining retractors were placed. Sensory nerves that were associated with the cord structures were traced back to their origin from the  muscles laterally, clamped, divided, and ligated with 2-0 silk ties. The redundant  nerve medially was resected. The cord structures were mobilized and encircled with a Penrose drain. Cremasteric muscle fibers were skeletonized. He did not have an indirect sac. He did have a large direct hernia which was dissected away from the cord structures. Once I had this completely cleaned off I simply reduced it and then oversewed the tissues with a running suture of 2-0 Vicryl. The floor of the inguinal canal was repaired and reinforced with an onlay graft of ultra Pro mesh. A 3" x 6" piece of ultra Pro mesh was brought to the operative field and trimmed at the corners to accommodate the anatomy of the wound. The mesh was sutured so as to generously overlap the pubic tubercle, then along the inguinal ligament inferiorly. Medially, superiorly, and superiolaterally several mattress sutures of 2-0 Prolene were placed to secure the mesh. The mesh was incised laterally so as to wrap around the cord structures at the internal ring. The tails of the mesh were overlapped laterally and some Prolene sutures were placed laterally. This provided very secure repair both medial and lateral to the internal ring but allowed  adequate fingertip opening for the cord structures. There was excellent hemostasis. We was irrigated with saline. The external oblique was closed with a running suture of 2-0 Vicryl. Scarpa's fascia was closed with 3-0 Vicryl and  the skin closed with running subcuticular suture of 4-0 Monocryl and Dermabond. The patient tolerated the procedure well was taken to PACU in stable condition. EBL 10 cc. Counts correct. Complications none.     Angelia Mould. Derrell Lolling, M.D., FACS General and Minimally Invasive Surgery Breast and Colorectal Surgery  05/17/2013 12:40 PM

## 2013-05-17 NOTE — Anesthesia Postprocedure Evaluation (Signed)
Anesthesia Post Note  Patient: Brian Vaughan  Procedure(s) Performed: Procedure(s) (LRB): INSERTION OF MESH (Left) HERNIA REPAIR INGUINAL ADULT (Left)  Anesthesia type: general  Patient location: PACU  Post pain: Pain level controlled  Post assessment: Patient's Cardiovascular Status Stable  Last Vitals:  Filed Vitals:   05/17/13 1413  BP: 157/92  Pulse: 66  Temp: 36.6 C  Resp: 16    Post vital signs: Reviewed and stable  Level of consciousness: sedated  Complications: No apparent anesthesia complications

## 2013-05-17 NOTE — Progress Notes (Signed)
Assisted Dr. Ossey with left, ultrasound guided, transabdominal plane block. Side rails up, monitors on throughout procedure. See vital signs in flow sheet. Tolerated Procedure well. 

## 2013-05-20 ENCOUNTER — Encounter (HOSPITAL_BASED_OUTPATIENT_CLINIC_OR_DEPARTMENT_OTHER): Payer: Self-pay | Admitting: General Surgery

## 2013-05-28 ENCOUNTER — Telehealth (INDEPENDENT_AMBULATORY_CARE_PROVIDER_SITE_OTHER): Payer: Self-pay | Admitting: General Surgery

## 2013-05-28 NOTE — Telephone Encounter (Signed)
Pt's wife called to request refill of pain meds.  Pt has LIH repaired on 05/17/13, is progressing well, but still using some pain medicine.  Per Protocol, Norco 5/325 mg, # 30 (thirty,) 1 po Q4-6H prn pain, no refill issued and signed by Dr. Donell BeersByerly (for Dr. Derrell LollingIngram.)  Rx placed at the front desk for pick-up.

## 2013-06-13 ENCOUNTER — Encounter (INDEPENDENT_AMBULATORY_CARE_PROVIDER_SITE_OTHER): Payer: Self-pay | Admitting: General Surgery

## 2013-06-13 ENCOUNTER — Encounter (INDEPENDENT_AMBULATORY_CARE_PROVIDER_SITE_OTHER): Payer: Self-pay

## 2013-06-13 ENCOUNTER — Ambulatory Visit (INDEPENDENT_AMBULATORY_CARE_PROVIDER_SITE_OTHER): Payer: PRIVATE HEALTH INSURANCE | Admitting: General Surgery

## 2013-06-13 VITALS — BP 142/78 | HR 72 | Temp 97.5°F | Resp 18 | Ht 75.0 in | Wt 214.0 lb

## 2013-06-13 DIAGNOSIS — K4090 Unilateral inguinal hernia, without obstruction or gangrene, not specified as recurrent: Secondary | ICD-10-CM

## 2013-06-13 NOTE — Progress Notes (Signed)
Patient ID: Brian MountMilton Vaughan, male   DOB: Oct 13, 1955, 58 y.o.   MRN: [id number] History: This patient underwent open repair of left inguinal hernia with mesh on 05/17/2013. He is recovering uneventfully. Pain is subsiding. No neuropathic pain. No hematoma or ecchymoses reported  Exam:  Patient looks well. In no distress Left inguinal incision is healing normally. Minimal swelling. No hematoma. No infection. Repair appears intact. Penis scrotum and testes are normal  Assessment: Left inguinal hernia, recovering uneventfully following open repair with mesh  Plan: Return to work without restrictions February 9 Return to see me as needed.   Brian Vaughan, M.D., [facility] General and Minimally invasive Surgery Breast and Colorectal Surgery Office:   [phone] Pager:   [phone]

## 2013-06-13 NOTE — Patient Instructions (Signed)
You are recovering from your left inguinal hernia repair with mesh without any obvious surgical complications.  He may return to work Monday,Feb. 9,  without any restrictions.  Return to see Dr. Derrell LollingIngram if further problems arise

## 2018-04-28 ENCOUNTER — Other Ambulatory Visit: Payer: Self-pay

## 2018-04-28 ENCOUNTER — Emergency Department: Payer: Self-pay

## 2018-04-28 ENCOUNTER — Emergency Department
Admission: EM | Admit: 2018-04-28 | Discharge: 2018-04-28 | Disposition: A | Discharge status: Home / Self Care | Payer: Self-pay | Attending: Emergency Medicine | Admitting: Emergency Medicine

## 2018-04-28 ENCOUNTER — Encounter: Payer: Self-pay | Admitting: *Deleted

## 2018-04-28 DIAGNOSIS — Y9389 Activity, other specified: Secondary | ICD-10-CM | POA: Insufficient documentation

## 2018-04-28 DIAGNOSIS — Y9269 Other specified industrial and construction area as the place of occurrence of the external cause: Secondary | ICD-10-CM | POA: Insufficient documentation

## 2018-04-28 DIAGNOSIS — M5431 Sciatica, right side: Secondary | ICD-10-CM | POA: Insufficient documentation

## 2018-04-28 DIAGNOSIS — X500XXA Overexertion from strenuous movement or load, initial encounter: Secondary | ICD-10-CM | POA: Insufficient documentation

## 2018-04-28 DIAGNOSIS — S8011XA Contusion of right lower leg, initial encounter: Secondary | ICD-10-CM | POA: Insufficient documentation

## 2018-04-28 DIAGNOSIS — Y990 Civilian activity done for income or pay: Secondary | ICD-10-CM | POA: Insufficient documentation

## 2018-04-28 DIAGNOSIS — Z79899 Other long term (current) drug therapy: Secondary | ICD-10-CM | POA: Insufficient documentation

## 2018-04-28 DIAGNOSIS — M79604 Pain in right leg: Secondary | ICD-10-CM | POA: Insufficient documentation

## 2018-04-28 NOTE — ED Notes (Signed)
Patient transported to X-ray 

## 2018-04-28 NOTE — Discharge Instructions (Addendum)
Return to the ER for new, worsening, persistent severe pain, any difficulty walking or bearing weight, weakness or numbness, coldness or decrease in circulation, or any other new or worsening symptoms that concern you.

## 2018-04-28 NOTE — ED Provider Notes (Signed)
[facility] Emergency Department Provider Note ____________________________________________   First MD Initiated Contact with Patient 04/28/18 1720     (approximate)  I have reviewed the triage vital signs and the nursing notes.   HISTORY  Chief Complaint Back Pain    HPI Glendell Fouse is a 62 y.o. male with PMH as noted below who presents with a right lower leg pain, acute onset after the patient twisted his knee while carrying a heavy tool kit last month.  The patient states that it swelled and was bruised and discolored and has been bothering him since.  He states he is concerned that he broke the leg.  The patient states that a few weeks ago he also had a fall onto his lower back and since then has had right lower back pain radiating to the leg with some intermittent tingling.  He denies numbness or weakness.  He states that he has woken up a few times with the right leg being cold and painful but this resolves quickly.  He denies any pain with walking or any type of claudication.  Past Medical History:  Diagnosis Date  . Chronic lumbar pain    lumbar DDD per xrays  . GERD (gastroesophageal reflux disease)   . History of chicken pox   . History of depression   . HLD (hyperlipidemia)   . Migraine   . Wears glasses     Patient Active Problem List   Diagnosis Date Noted  . Left inguinal hernia 10/30/2012  . BPPV (benign paroxysmal positional vertigo) 10/30/2012  . Migraine   . HLD (hyperlipidemia)   . GERD (gastroesophageal reflux disease)   . Chronic lumbar pain     Past Surgical History:  Procedure Laterality Date  . BRAIN SURGERY    . HERNIA REPAIR Right 1969  . HERNIA REPAIR Left 2015   direct with mesh Dalbert Batman)  . INGUINAL HERNIA REPAIR Left 05/17/2013   Procedure: HERNIA REPAIR INGUINAL ADULT;  Surgeon: Adin Hector, MD;  Location: [place];  Service: General;  Laterality: Left;  . INSERTION OF MESH Left 05/17/2013     Procedure: INSERTION OF MESH;  Surgeon: Adin Hector, MD;  Location: [place];  Service: General;  Laterality: Left;  . WISDOM TOOTH EXTRACTION      Prior to Admission medications   Medication Sig Start Date End Date Taking? Authorizing Provider  ibuprofen (ADVIL,MOTRIN) 800 MG tablet Take 1 tablet (800 mg total) by mouth every 8 (eight) hours as needed. 04/26/13   Fanny Skates, MD  Multiple Vitamins-Minerals (MULTIVITAMIN PO) Take 1 tablet by mouth daily.    [provider]    Allergies Codeine and Vicodin [hydrocodone-acetaminophen]  Family History  Problem Relation Age of Onset  . Arthritis Mother   . Heart disease Mother        CHF  . Arthritis Father   . Alcohol abuse Father   . CAD Father 49       smoker, drinker  . Arthritis Maternal Grandmother   . CAD Maternal Grandmother   . Arthritis Maternal Grandfather   . CAD Maternal Grandfather   . Stroke Neg Hx   . Cancer Neg Hx   . Diabetes Neg Hx     Social History Social History   Tobacco Use  . Smoking status: Never Smoker  . Smokeless tobacco: Never Used  Substance Use Topics  . Alcohol use: Yes    Comment: seldom  . Drug use:  No    Review of Systems  Constitutional: No fever. Eyes: No redness. ENT: No neck pain. Cardiovascular: Denies chest pain. Respiratory: Denies shortness of breath. Gastrointestinal: No abdominal pain.  Genitourinary: Negative for flank pain.  Musculoskeletal: Positive for for back pain. Skin: Negative for rash. Neurological: Negative for focal weakness or numbness.   ____________________________________________   PHYSICAL EXAM:  VITAL SIGNS: ED Triage Vitals  Enc Vitals Group     BP 04/28/18 1702 127/77     Pulse Rate 04/28/18 1702 65     Resp 04/28/18 1702 16     Temp 04/28/18 1702 98.2 F (36.8 C)     Temp Source 04/28/18 1702 Oral     SpO2 04/28/18 1702 96 %     Weight 04/28/18 1703 215 lb (97.5 kg)     Height 04/28/18 1703 6'  3" (1.905 m)     Head Circumference --      Peak Flow --      Pain Score 04/28/18 1702 8     Pain Loc --      Pain Edu? --      Excl. in Home? --     Constitutional: Alert and oriented. Well appearing and in no acute distress. Eyes: Conjunctivae are normal.  Head: Atraumatic. Nose: No congestion/rhinnorhea. Mouth/Throat: Mucous membranes are moist.   Neck: Normal range of motion.  Cardiovascular:  Good peripheral circulation. Respiratory: Normal respiratory effort.  Gastrointestinal:  No distention.  Musculoskeletal: Extremities warm and well perfused.  Right proximal tibia with mild tenderness but no deformity and no step-off.  FROM of right knee with no effusion or bony tenderness.  2+ DP pulses bilaterally.  Normal coloration to bilateral lower extremities.  No midline spinal tenderness.  Mild right lumbar paraspinal muscle tenderness.  Negative straight leg raise. Neurologic:  Normal speech and language. 5/5 motor strength and intact sensation of bilateral lower extremities. Skin:  Skin is warm and dry. No rash noted. Psychiatric: Mood and affect are normal. Speech and behavior are normal.  ____________________________________________   LABS (all labs ordered are listed, but only abnormal results are displayed)  Labs Reviewed - No data to display ____________________________________________  EKG   ____________________________________________  RADIOLOGY  XR R tib/fib: No acute fracture XR lumbar: No acute fracture  ____________________________________________   PROCEDURES  Procedure(s) performed: No  Procedures  Critical Care performed: No ____________________________________________   INITIAL IMPRESSION / ASSESSMENT AND PLAN / ED COURSE  Pertinent labs & imaging results that were available during my care of the patient were reviewed by me and considered in my medical decision making (see chart for details).  62 year old male with PMH as noted above  presents with right lower leg pain after twisting it last month, concerned that he may have fractured it but did not know.  He has been bearing weight without much difficulty.  He also reports right lower back pain radiating to the leg with some tingling occurring over the last few weeks after a fall onto his lower back.  On exam the patient is well-appearing and his vital signs are normal.  He has a normal neuro exam of the lower extremities and no midline spinal tenderness.  He has mild tenderness to the right proximal tibia but no deformity and no ecchymosis or other acute findings.  He has good distal pulses and circulation of bilateral lower extremities.  Overall I suspect that the patient had a tibial contusion and possibly a knee effusion that have resolved.  He also  likely has some mild right-sided sciatica.  There is no evidence of arterial or venous insufficiency.  He has no edema or swelling, and there is no clinical evidence to support DVT.  Given the lack of neuro deficits he does not require advanced imaging.  I will obtain an x-ray of the lower spine to rule out compression fracture or other acute abnormality, and x-ray of the right tibia.  ----------------------------------------- 7:34 PM on 04/28/2018 -----------------------------------------  X-rays are negative.  The patient is stable for discharge home.  I counseled him on the results of the imaging.  Return precautions given, and he expresses understanding. ____________________________________________   FINAL CLINICAL IMPRESSION(S) / ED DIAGNOSES  Final diagnoses:  Contusion of right tibia  Sciatica of right side      NEW MEDICATIONS STARTED DURING THIS VISIT:  New Prescriptions   No medications on file     Note:  This document was prepared using Dragon voice recognition software and may include unintentional dictation errors.    Arta Silence, MD 04/28/18 Joen Laura
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
## 2019-08-05 ENCOUNTER — Other Ambulatory Visit: Payer: Self-pay

## 2019-08-05 ENCOUNTER — Emergency Department
Admission: EM | Admit: 2019-08-05 | Discharge: 2019-08-06 | Disposition: A | Discharge status: Other Acute Inpatient Hospital | Payer: Self-pay | Attending: Emergency Medicine | Admitting: Emergency Medicine

## 2019-08-05 ENCOUNTER — Emergency Department: Payer: Self-pay

## 2019-08-05 DIAGNOSIS — Z20822 Contact with and (suspected) exposure to covid-19: Secondary | ICD-10-CM | POA: Insufficient documentation

## 2019-08-05 DIAGNOSIS — Y9259 Other trade areas as the place of occurrence of the external cause: Secondary | ICD-10-CM | POA: Insufficient documentation

## 2019-08-05 DIAGNOSIS — Z79899 Other long term (current) drug therapy: Secondary | ICD-10-CM | POA: Insufficient documentation

## 2019-08-05 DIAGNOSIS — Y9389 Activity, other specified: Secondary | ICD-10-CM | POA: Insufficient documentation

## 2019-08-05 DIAGNOSIS — Y990 Civilian activity done for income or pay: Secondary | ICD-10-CM | POA: Insufficient documentation

## 2019-08-05 DIAGNOSIS — Z23 Encounter for immunization: Secondary | ICD-10-CM | POA: Insufficient documentation

## 2019-08-05 DIAGNOSIS — S61442A Puncture wound with foreign body of left hand, initial encounter: Secondary | ICD-10-CM | POA: Insufficient documentation

## 2019-08-05 DIAGNOSIS — S61432A Puncture wound without foreign body of left hand, initial encounter: Secondary | ICD-10-CM

## 2019-08-05 DIAGNOSIS — W458XXA Other foreign body or object entering through skin, initial encounter: Secondary | ICD-10-CM | POA: Insufficient documentation

## 2019-08-05 DIAGNOSIS — L089 Local infection of the skin and subcutaneous tissue, unspecified: Secondary | ICD-10-CM

## 2019-08-05 NOTE — ED Notes (Signed)
Pt requests not to have blood work completed at this time d/t not having insurance and trying to save on visit costs.
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
## 2019-08-06 LAB — CBC WITH DIFFERENTIAL/PLATELET
Abs Immature Granulocytes: 0.02 10*3/uL (ref 0.00–0.07)
Basophils Absolute: 0.1 10*3/uL (ref 0.0–0.1)
Basophils Relative: 1 %
Eosinophils Absolute: 0.3 10*3/uL (ref 0.0–0.5)
Eosinophils Relative: 4 %
HCT: 41.9 % (ref 39.0–52.0)
Hemoglobin: 13.7 g/dL (ref 13.0–17.0)
Immature Granulocytes: 0 %
Lymphocytes Relative: 37 %
Lymphs Abs: 2.3 10*3/uL (ref 0.7–4.0)
MCH: 30.2 pg (ref 26.0–34.0)
MCHC: 32.7 g/dL (ref 30.0–36.0)
MCV: 92.5 fL (ref 80.0–100.0)
Monocytes Absolute: 0.5 10*3/uL (ref 0.1–1.0)
Monocytes Relative: 8 %
Neutro Abs: 3.2 10*3/uL (ref 1.7–7.7)
Neutrophils Relative %: 50 %
Platelets: 250 10*3/uL (ref 150–400)
RBC: 4.53 MIL/uL (ref 4.22–5.81)
RDW: 12.5 % (ref 11.5–15.5)
WBC: 6.3 10*3/uL (ref 4.0–10.5)
nRBC: 0 % (ref 0.0–0.2)

## 2019-08-06 LAB — RESPIRATORY PANEL BY RT PCR (FLU A&B, COVID)
Influenza A by PCR: NEGATIVE
Influenza B by PCR: NEGATIVE
SARS Coronavirus 2 by RT PCR: NEGATIVE

## 2019-08-06 LAB — BASIC METABOLIC PANEL
Anion gap: 10 (ref 5–15)
BUN: 29 mg/dL — ABNORMAL HIGH (ref 8–23)
CO2: 29 mmol/L (ref 22–32)
Calcium: 9.1 mg/dL (ref 8.9–10.3)
Chloride: 99 mmol/L (ref 98–111)
Creatinine, Ser: 1.31 mg/dL — ABNORMAL HIGH (ref 0.61–1.24)
GFR calc Af Amer: >60 mL/min (ref >60)
GFR calc non Af Amer: 58 mL/min — ABNORMAL LOW (ref >60)
Glucose, Bld: 105 mg/dL — ABNORMAL HIGH (ref 70–99)
Potassium: 3.9 mmol/L (ref 3.5–5.1)
Sodium: 138 mmol/L (ref 135–145)

## 2019-08-06 MED ORDER — LORAZEPAM 2 MG/ML IJ SOLN
1.0000 mg | Freq: Once | INTRAMUSCULAR | Status: AC
  Administered 2019-08-06: 01:00:00 1 mg via INTRAVENOUS
  Filled 2019-08-06: qty 1

## 2019-08-06 MED ORDER — PIPERACILLIN-TAZOBACTAM 3.375 G IVPB 30 MIN
3.3750 g | Freq: Once | INTRAVENOUS | Status: AC
Start: 2019-08-06 — End: 2019-08-06
  Administered 2019-08-06: 3.375 g via INTRAVENOUS
  Filled 2019-08-06: qty 50

## 2019-08-06 MED ORDER — TETANUS-DIPHTH-ACELL PERTUSSIS 5-2.5-18.5 LF-MCG/0.5 IM SUSP
0.5000 mL | Freq: Once | INTRAMUSCULAR | Status: AC
  Administered 2019-08-06: 01:00:00 0.5 mL via INTRAMUSCULAR
  Filled 2019-08-06: qty 0.5

## 2019-08-06 NOTE — Discharge Instructions (Signed)
AS WE DISCUSSED, GO TO THE ER AT [facility] AT [address], [place]  TELL THEM YOU ARE HERE FOR A MEDICAL TRANSFER FROM [facility] - THEY SHOULD LET YOU BACK   THE HAND SURGERY TEAM AND ER DOCTOR SHOULD BE AWARE THAT YOU ARE COMING  GO DIRECTLY TO THE ER.   TRYING TO PUT THIS OFF OR TAKE ANTIBIOTICS ONLY COULD LEAD TO EVENTUAL WORSENING INFECTION, PAIN, AND LOSS OF YOUR HAND
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
## 2020-01-24 IMAGING — CR DG LUMBAR SPINE 2-3V
1 series · 3 of 3 positions shown · non-contrast
Comparison: None.

CLINICAL DATA: Fall with sciatica pain

EXAM:
LUMBAR SPINE - 2-3 VIEW

[Series 1: dg lumbar spine 2-3 views · 0.14mm/px · 3 of 3 slices shown]
[im 1/3]
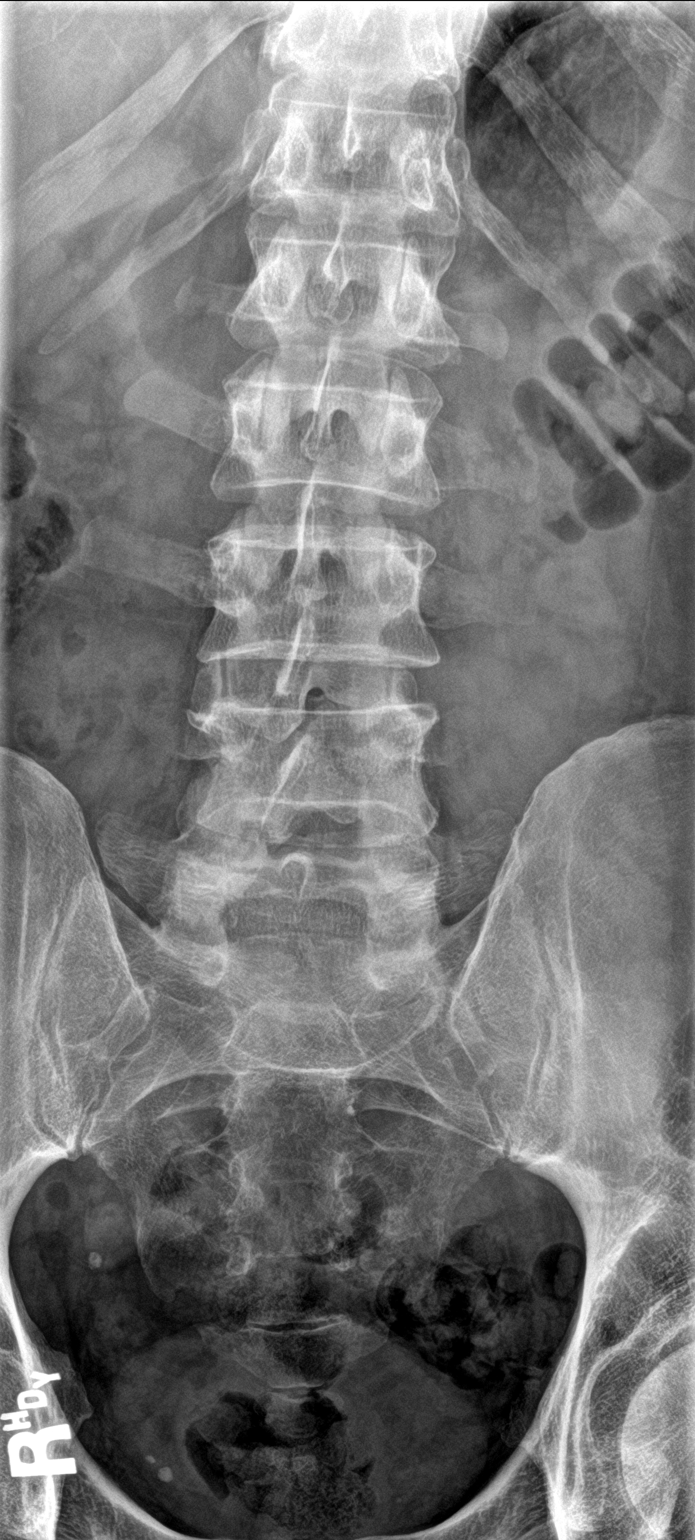
[im 2/3]
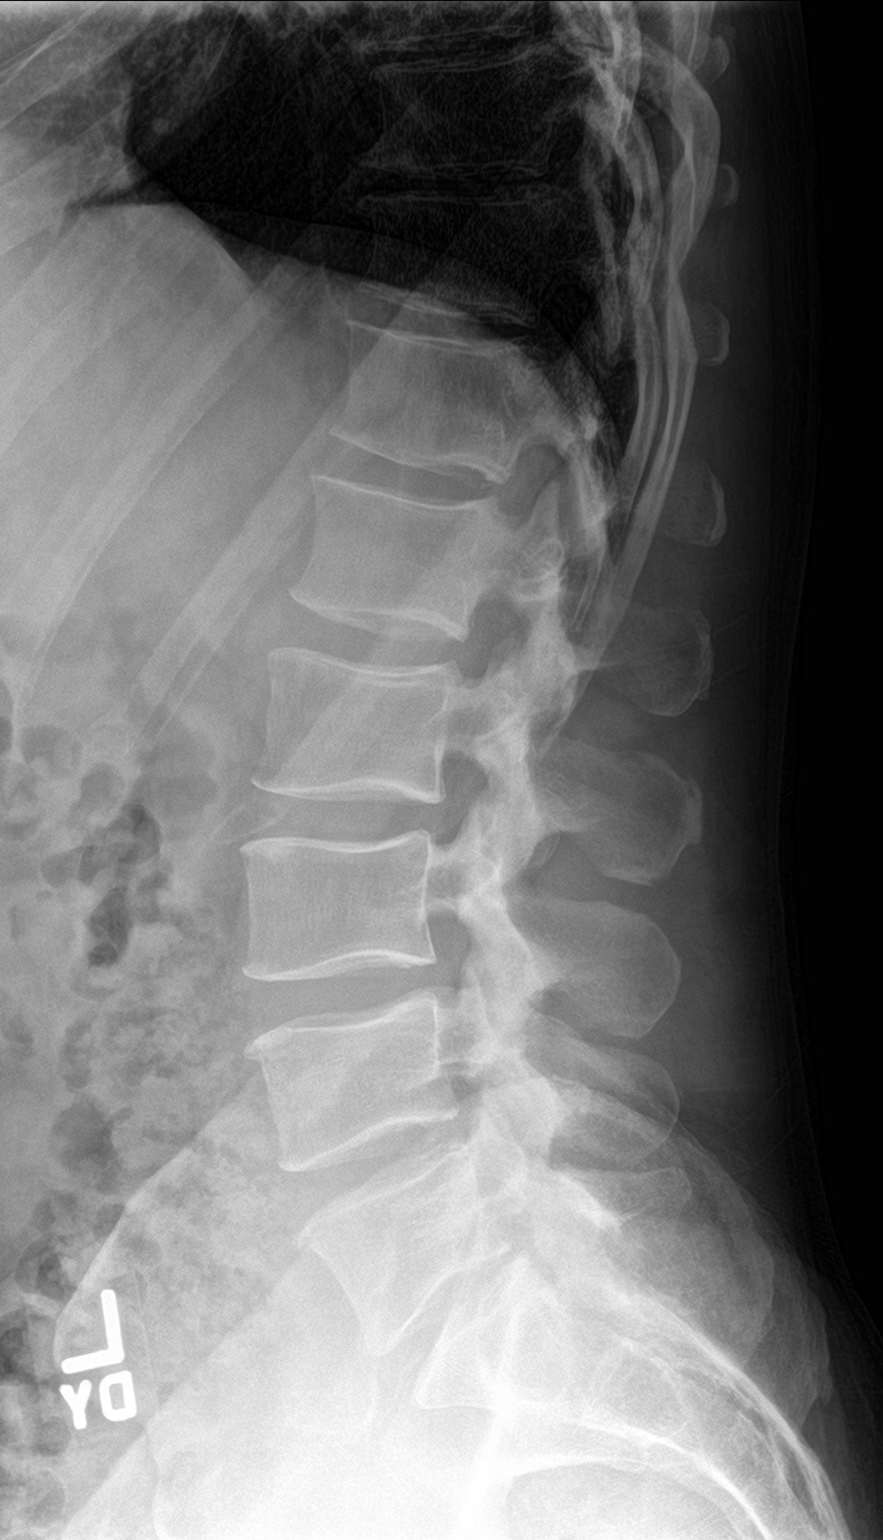
[im 3/3]
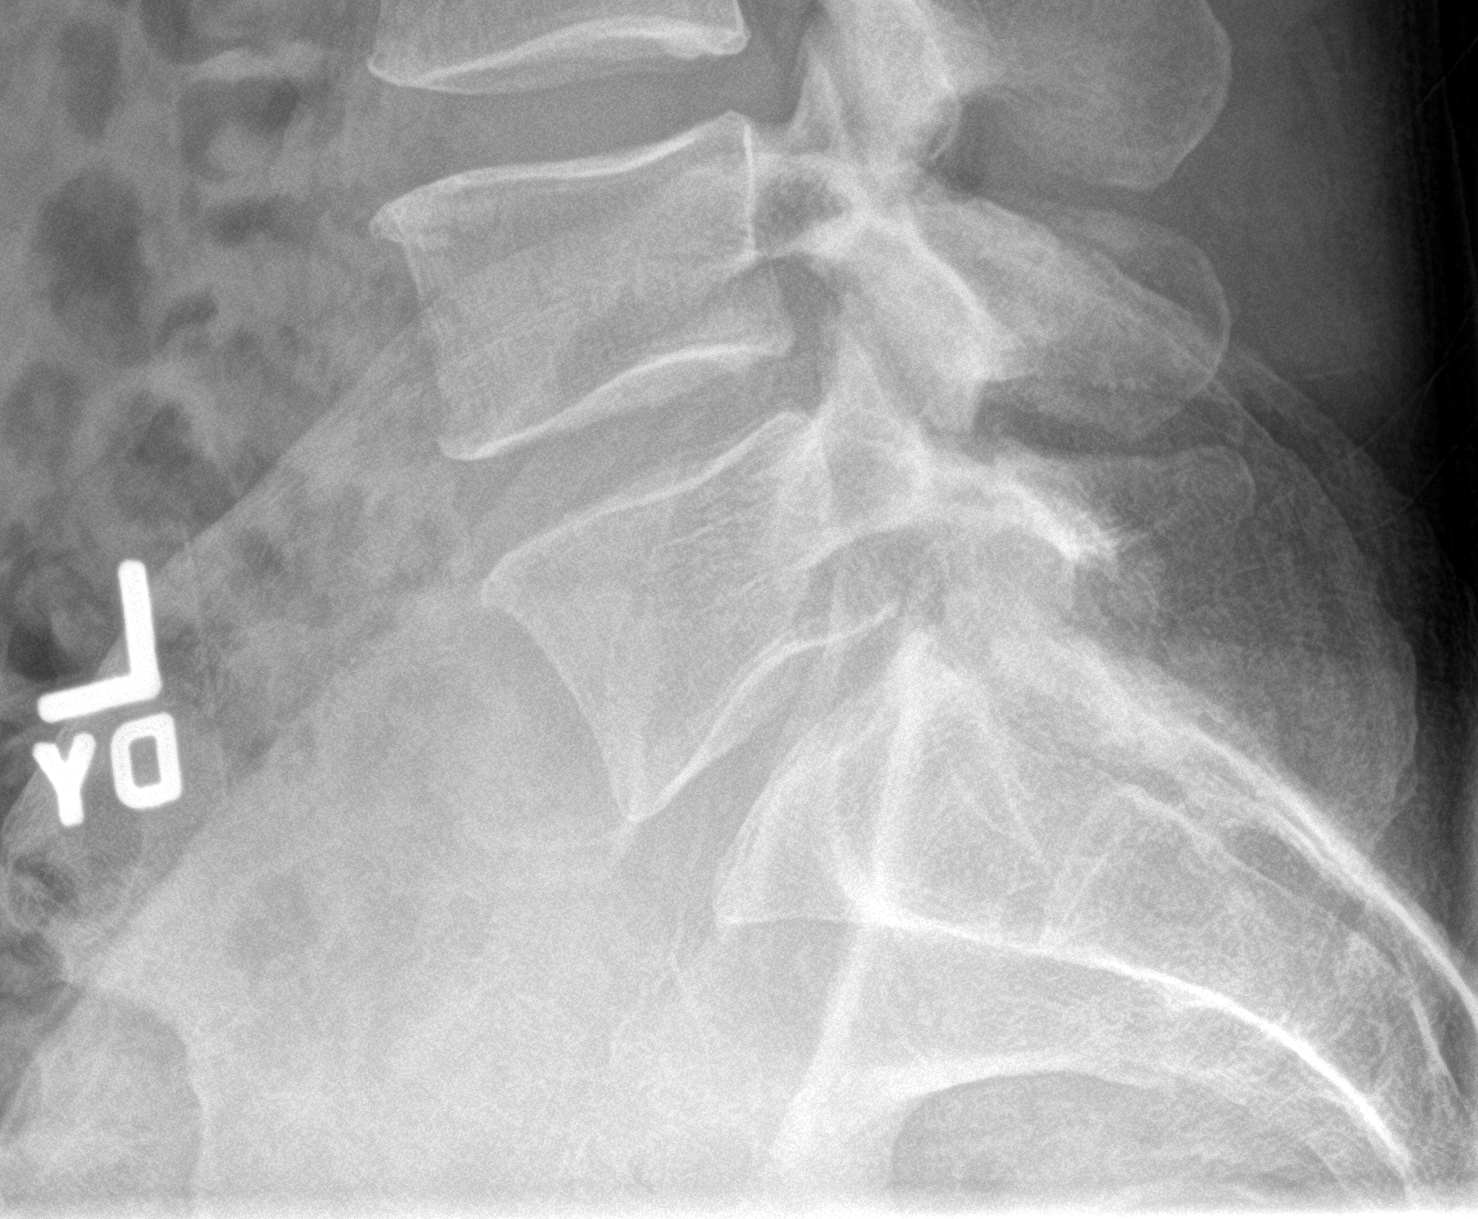

[3 of 3 positions shown; findings below may reference images not displayed]

FINDINGS: Five non rib-bearing lumbar type vertebra. Lumbar alignment within
normal limits. Vertebral body heights are maintained. Minimal
osteophyte L2 through L5. Maintained disc spaces.
IMPRESSION: No acute osseous abnormality

## 2020-01-24 IMAGING — CR DG TIBIA/FIBULA 2V*R*
1 series · 4 of 4 positions shown · non-contrast
Comparison: None.

CLINICAL DATA: Fall with leg pain

EXAM:
RIGHT TIBIA AND FIBULA - 2 VIEW

[Series 1: dg tibia/fibula right · 0.14mm/px · 4 of 4 slices shown]
[im 1/4]
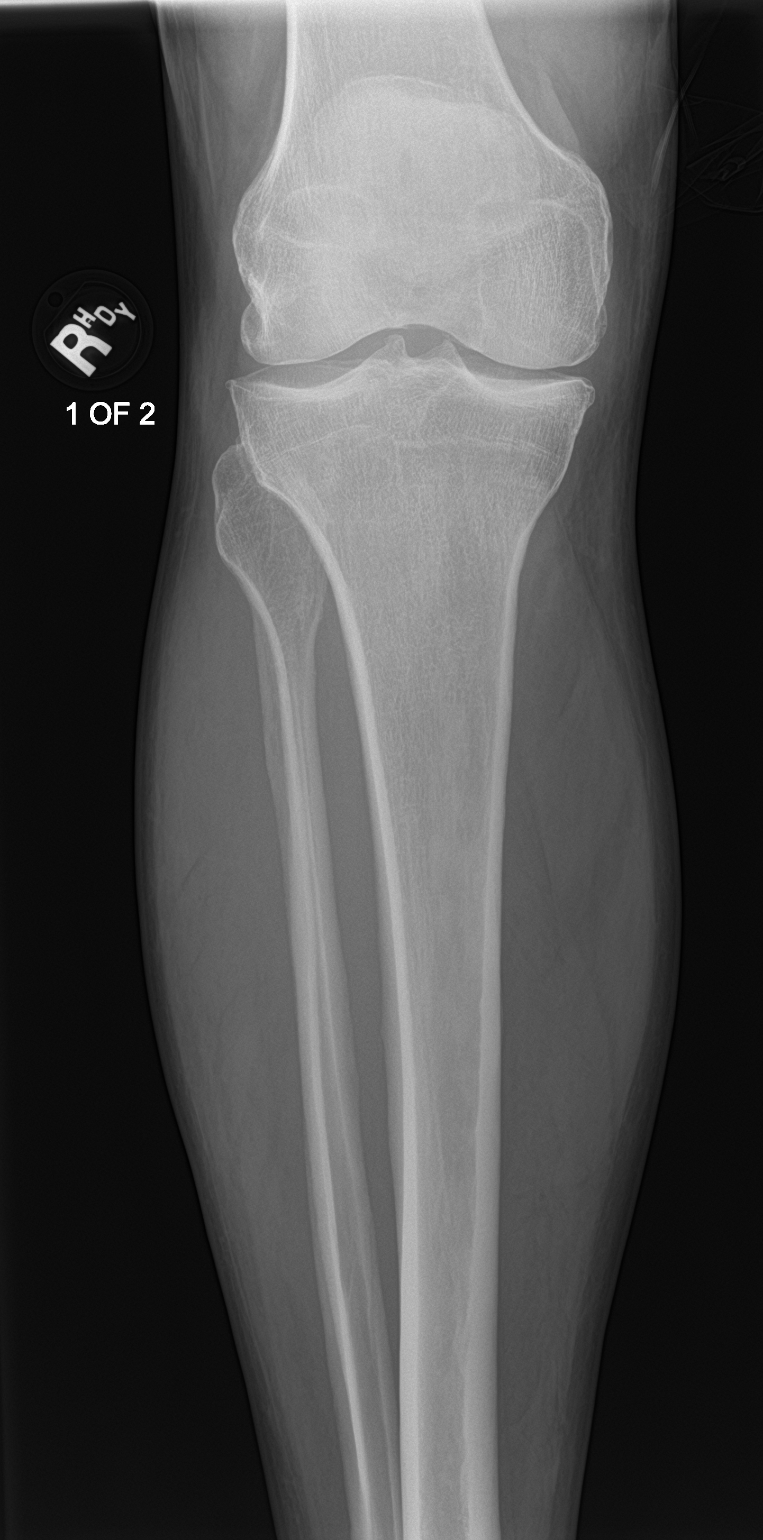
[im 2/4]
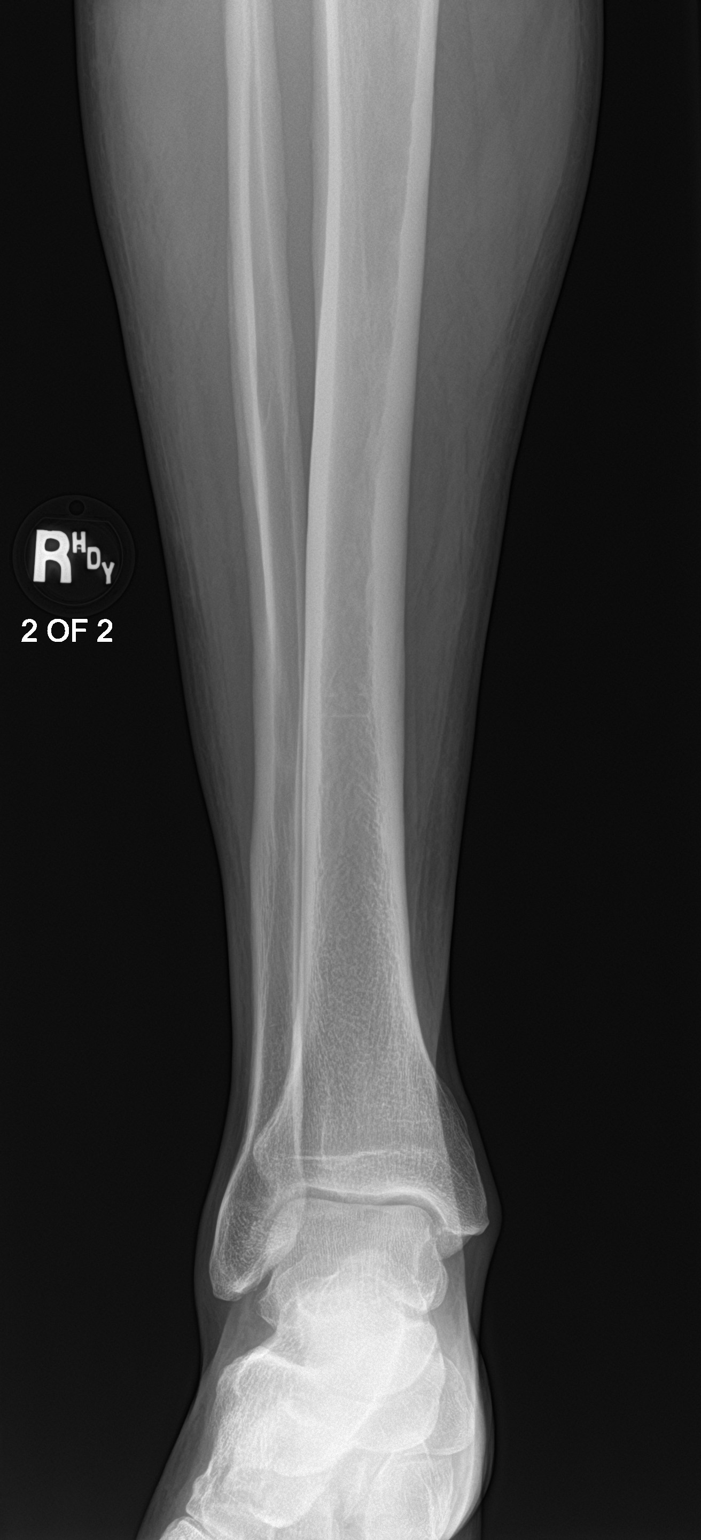
[im 3/4]
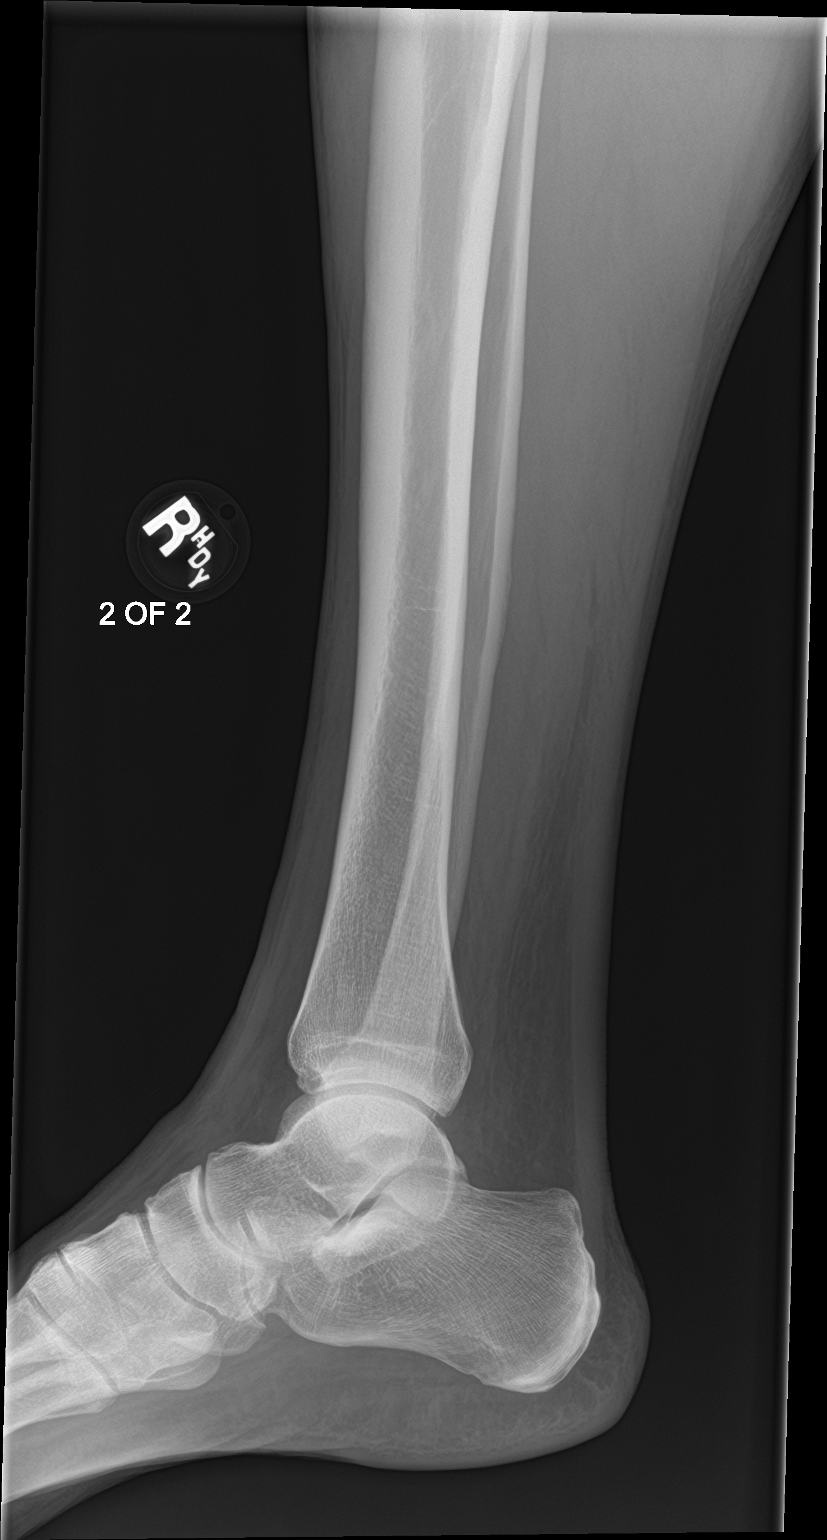
[im 4/4]
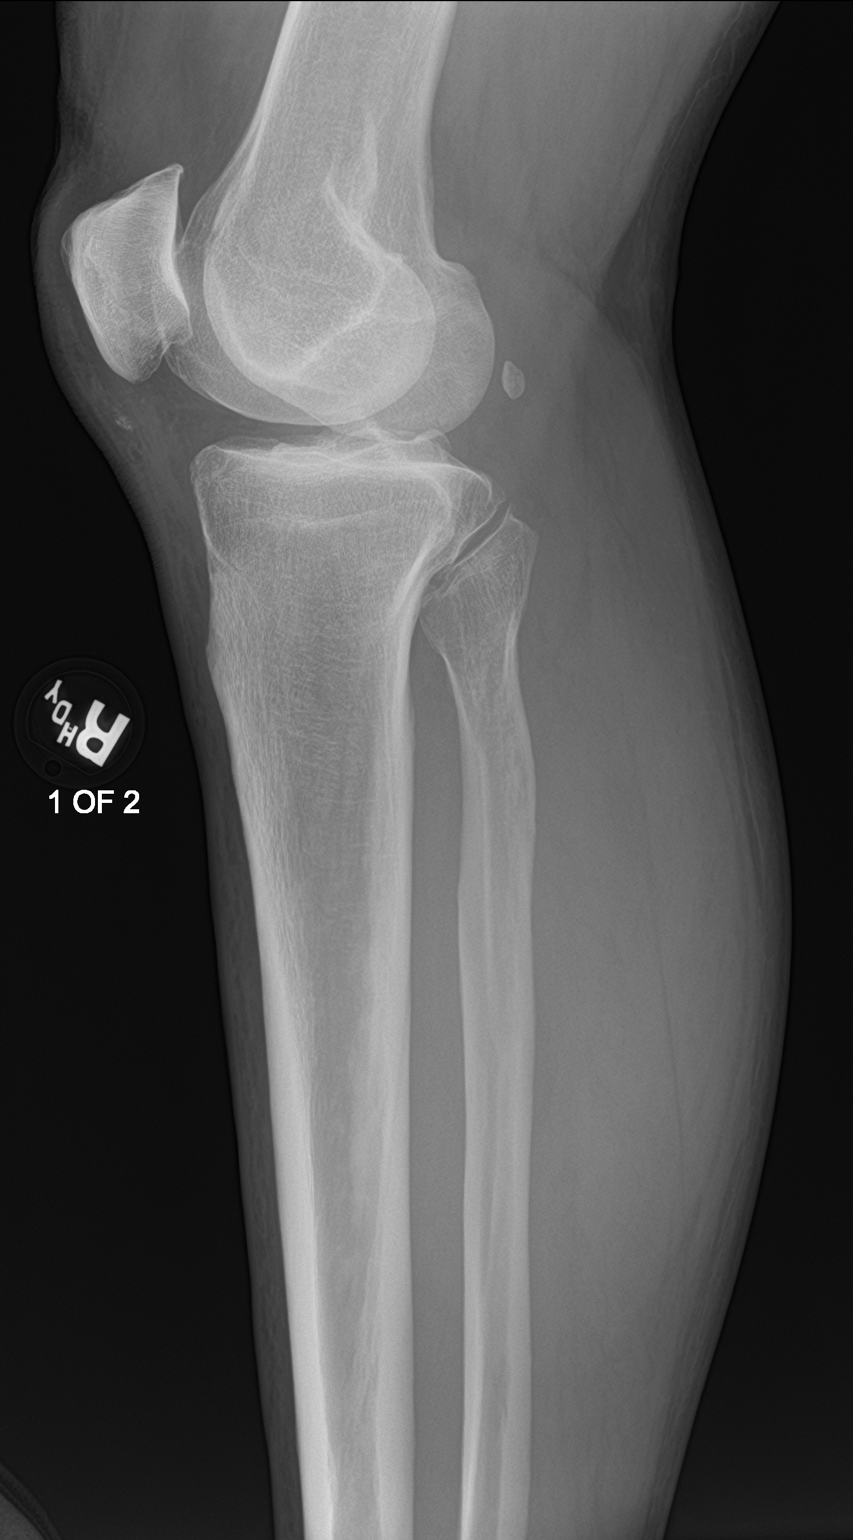

[4 of 4 positions shown; findings below may reference images not displayed]

FINDINGS: No fracture or malalignment. Mild patellofemoral degenerative
change. Soft tissues are unremarkable
IMPRESSION: No acute osseous abnormality

## 2021-05-02 IMAGING — CR DG HAND COMPLETE 3+V*L*
1 series · 3 of 3 positions shown · non-contrast
Comparison: None.

CLINICAL DATA: Blunt trauma several days ago with possible retained
foreign body, initial encounter

EXAM:
LEFT HAND - COMPLETE 3+ VIEW

[Series 1: x hand pa left · 0.14mm/px · 3 of 3 slices shown]
[im 1/3]
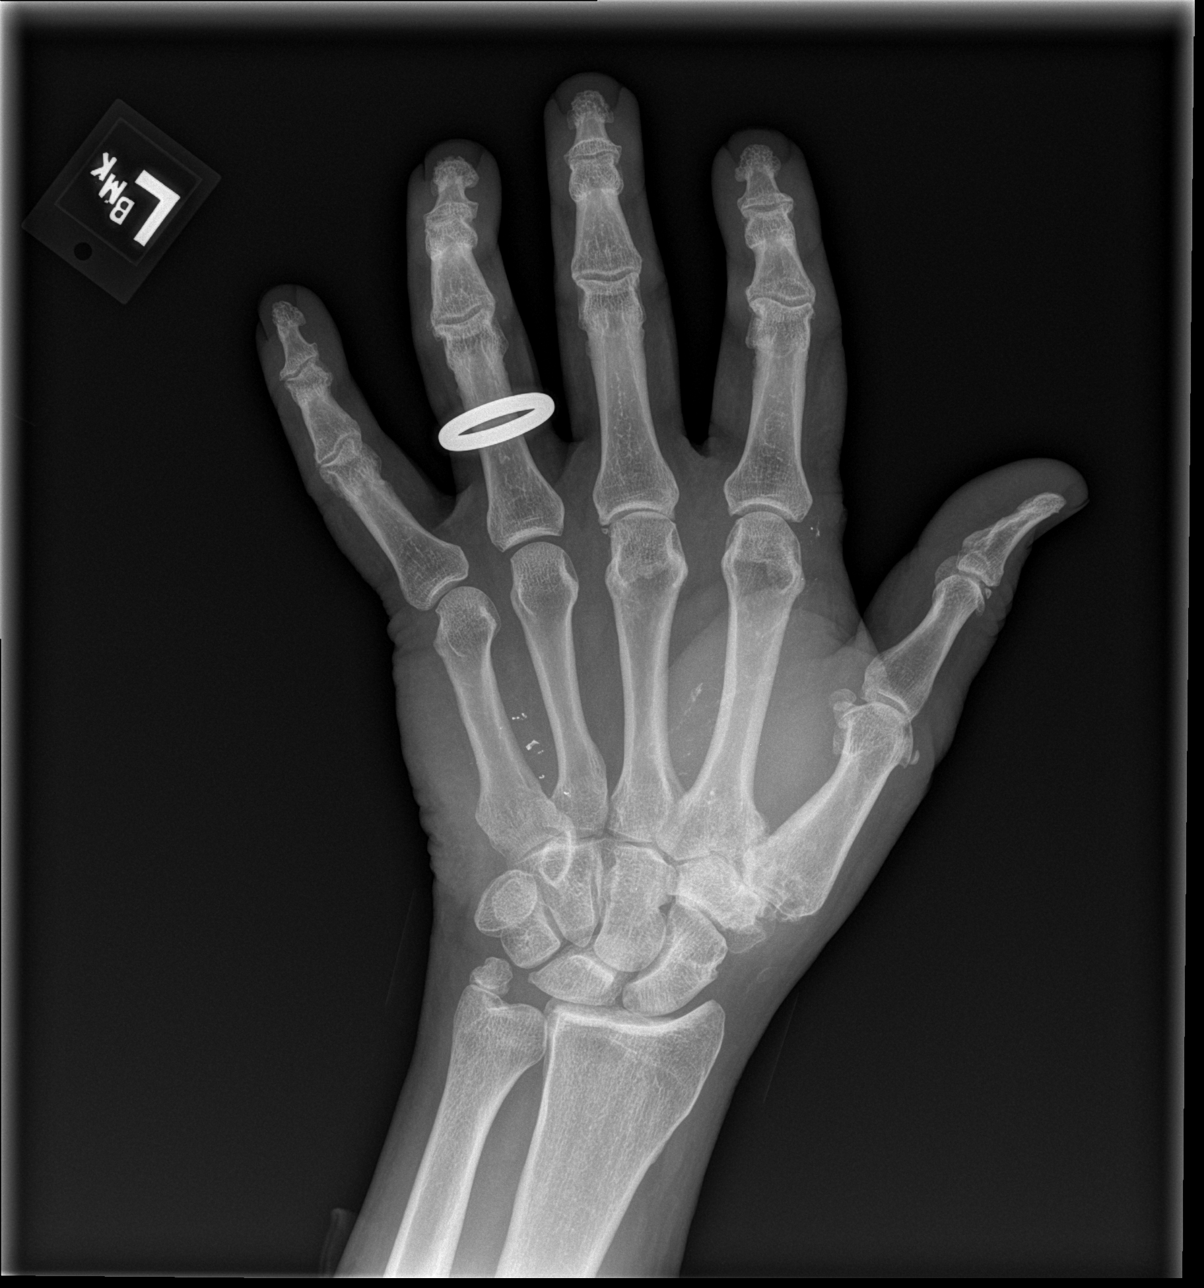
[im 2/3]
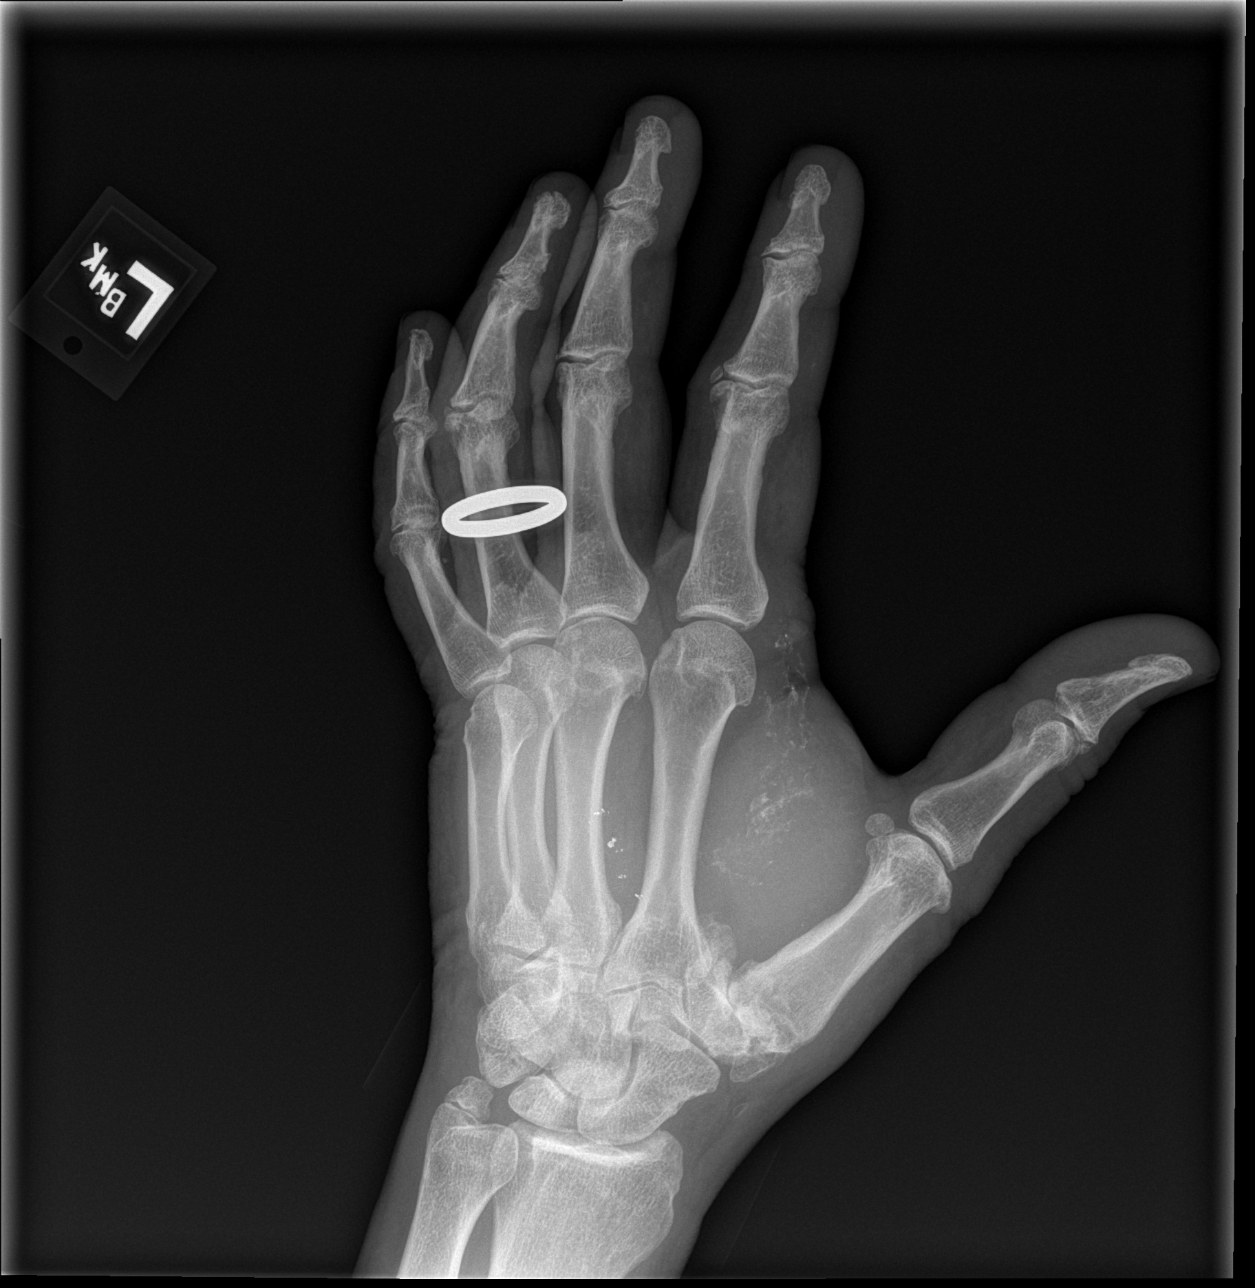
[im 3/3]
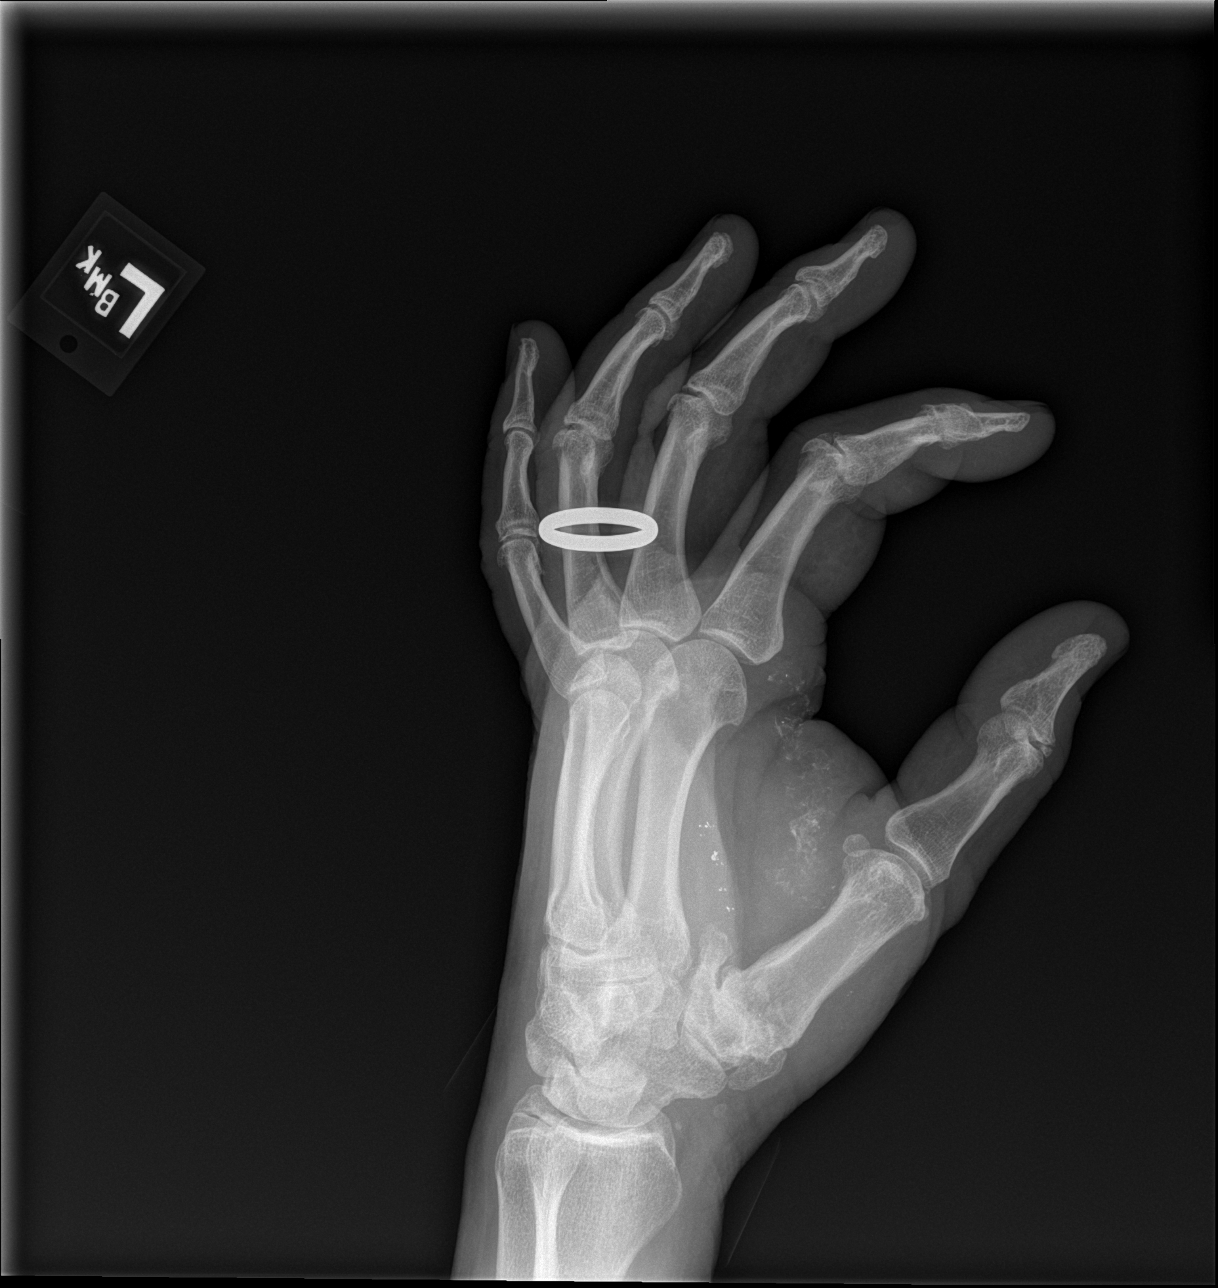

[3 of 3 positions shown; findings below may reference images not displayed]

FINDINGS: Degenerative changes are noted the first [facility] joint. Mild
interphalangeal degenerative changes are noted as well. No
definitive fracture or dislocation is seen. Radiopaque foreign
bodies are noted within the soft tissues of the palm overlying the
space between the fourth and fifth metacarpals. Some calcifications
are noted at lung the second metacarpal consistent with prior
injury. Prior ulnar styloid fracture with nonunion is noted.
IMPRESSION: Multiple metallic foreign bodies are noted as described which may be
related to the recent injury.

More chronic changes of the distal ulna and soft tissues near the
second metacarpal.

## 2022-12-14 ENCOUNTER — Ambulatory Visit: Payer: Medicare PPO | Admitting: Primary Care

## 2022-12-14 ENCOUNTER — Encounter: Payer: Self-pay | Admitting: Primary Care

## 2022-12-14 VITALS — BP 126/78 | HR 58 | Temp 98.0°F | Ht 75.0 in | Wt 208.0 lb

## 2022-12-14 DIAGNOSIS — G2581 Restless legs syndrome: Secondary | ICD-10-CM | POA: Diagnosis not present

## 2022-12-14 DIAGNOSIS — M545 Low back pain, unspecified: Secondary | ICD-10-CM | POA: Diagnosis not present

## 2022-12-14 DIAGNOSIS — M79601 Pain in right arm: Secondary | ICD-10-CM

## 2022-12-14 DIAGNOSIS — G8929 Other chronic pain: Secondary | ICD-10-CM | POA: Insufficient documentation

## 2022-12-14 DIAGNOSIS — M2550 Pain in unspecified joint: Secondary | ICD-10-CM

## 2022-12-14 NOTE — Patient Instructions (Addendum)
You will need to return for another visit to complete labs and further evaluation of your symptoms.

## 2022-12-14 NOTE — Assessment & Plan Note (Addendum)
Exam today and limited. No alarm signs in HPI.  Would have recommended gabapentin 100 to 300 mg HS for treatment; however, he became upset during his lab draw and stormed out of the exam room.

## 2022-12-14 NOTE — Assessment & Plan Note (Addendum)
Exam limited as patient abruptly discontinued the visit; however he was able to demonstrate normal range of motion to right shoulder during HPI.  Differentials include tendinitis, gout, rheumatoid arthritis, osteoarthritis. Unfortunately, labs were unable to be obtained as he became upset and resisted continued blood draw.  Would recommend lab workup including uric acid, CCP, RF, sed rate, CRP, CBC.  Would also recommend sports medicine evaluation for suspected chronic tendinitis to the elbow; especially given his line of work.

## 2022-12-14 NOTE — Assessment & Plan Note (Signed)
Reviewed lumbar x-ray from 2019. 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  Original plans were to rule out rheumatoid arthritis by obtaining rheumatoid factor, CCP, sed rate, CRP. Would have also recommended physical therapy versus orthopedic referral.  Unfortunately, patient stormed out of the room before an exam and further workup could be obtained.  Our phlebotomist was unable to obtain any blood.   No apparent injury was noted to the patient or phlebotomist.

## 2022-12-14 NOTE — Progress Notes (Signed)
Subjective:    Patient ID: Brian Vaughan, male    DOB: 1955-11-21, 67 y.o.   MRN: [id number]  Insomnia    Brian Vaughan is a very pleasant 67 y.o. male who presents today who presents today to establish care and discuss the problems mentioned below. Will obtain/review records.  1) Nevus: Chronic for the last "20 years, I don't know". The nevus is itchy. "My wife wants it removed today".   2) Osteoarthritis/Restless Legs: Chronic history of joint pain, decrease in ROM with flexion and swelling to his right elbow, and right upper extremity weakness. He has difficulty grasping objects, sometimes has to drink his coffee with two hands.  Also with chronic right lateral knee pain and swelling that is mostly noticeable when walking.   He denies shoulder pain, pain to the left upper extremity, elbow or knee. He also denies numbness/tingling to the right lower extremity.   Also with chronic right lower back pain. He has experienced pain down his right lower extremity, last episode was years ago. He denies numbness to his right lower extremity.  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  Chronic history of leg jerking, pain, and restlessness to his bilateral legs that begins as soon as he gets into bed. He toss and turns for about 1 hour, then falls asleep. He's tried Tylenol PM, Melatonin without improvement.    BP Readings from Last 3 Encounters:  12/14/22 126/78  08/06/19 125/68  04/28/18 136/85     Review of Systems  Musculoskeletal:  Positive for arthralgias, back pain and joint swelling.  Skin:  Negative for color change.       Nevus  Neurological:  Positive for weakness. Negative for numbness.  Psychiatric/Behavioral:  The patient has insomnia.          Past Medical History:  Diagnosis Date   Chronic lumbar pain    lumbar DDD per xrays   GERD (gastroesophageal reflux disease)    History of  chicken pox    History of depression    HLD (hyperlipidemia)    Left inguinal hernia 10/30/2012   Migraine    Wears glasses     Social History   Socioeconomic History   Marital status: Married    Spouse name: Not on file   Number of children: Not on file   Years of education: Not on file   Highest education level: Not on file  Occupational History   Not on file  Tobacco Use   Smoking status: Never   Smokeless tobacco: Never  Substance and Sexual Activity   Alcohol use: Yes    Comment: seldom   Drug use: No   Sexual activity: Not on file  Other Topics Concern   Not on file  Social History Narrative   Caffeine: 1 pot coffee/day   Lives with wife, no pets   Occupation: Psychologist, occupational, Chief Technology Officer at night   Edu: HS   Activity: no regular exercise   Social Determinants of Corporate investment banker Strain: Not on file  Food Insecurity: Not on file  Transportation Needs: Not on file  Physical Activity: Not on file  Stress: Not on file  Social Connections: Not on file  Intimate Partner Violence: Not on file    Past Surgical History:  Procedure Laterality Date   BRAIN SURGERY     HERNIA REPAIR Right 1969   HERNIA REPAIR  Left 2015   direct with mesh Derrell Lolling)   INGUINAL HERNIA REPAIR Left 05/17/2013   Procedure: HERNIA REPAIR INGUINAL ADULT;  Surgeon: Ernestene Mention, MD;  Location: [facility];  Service: General;  Laterality: Left;   INSERTION OF MESH Left 05/17/2013   Procedure: INSERTION OF MESH;  Surgeon: Ernestene Mention, MD;  Location: [facility];  Service: General;  Laterality: Left;   WISDOM TOOTH EXTRACTION      Family History  Problem Relation Age of Onset   Arthritis Mother    Heart disease Mother        CHF   Arthritis Father    Alcohol abuse Father    CAD Father 23       smoker, drinker   Arthritis Maternal Grandmother    CAD Maternal Grandmother    Arthritis Maternal Grandfather    CAD Maternal Grandfather    Stroke  Neg Hx    Cancer Neg Hx    Diabetes Neg Hx     Allergies  Allergen Reactions   Codeine Nausea Only   Vicodin [Hydrocodone-Acetaminophen] Nausea Only    No current outpatient medications on file prior to visit.   No current facility-administered medications on file prior to visit.    BP 126/78   Pulse (!) 58   Temp 98 F (36.7 C) (Temporal)   Ht 6\' 3"  (1.905 m)   Wt 208 lb (94.3 kg)   SpO2 97%   BMI 26.00 kg/m  Objective:   Physical Exam Pulmonary:     Effort: Pulmonary effort is normal.  Musculoskeletal:     Right shoulder: Normal range of motion.     Right elbow: Decreased range of motion.  Skin:    General: Skin is warm and dry.     Comments: Nevus was not examined as patient exited the exam room prior to examination.  Neurological:     Mental Status: He is alert and oriented to person, place, and time.  Psychiatric:     Comments: Poor eye contact, short closed ended answers, appeared annoyed during visit.     Exam overall was limited.   During HPI, our phlebotomist entered the room to obtain lab work as the lab was closing.  Our phlebotomist proceeded to draw labs, the patient became aggressive due to pain that he felt from the needlestick.  The phlebotomist discontinued her lab draw and attempted to remove the needle when he forced his arm away, ripped off his tourniquet, stated "this is not jail", and walked out of the room.  Visit ended here.       Assessment & Plan:  Chronic joint pain  Chronic right-sided low back pain without sciatica Assessment & Plan: Reviewed lumbar x-ray from 2019. 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  Original plans were to rule out rheumatoid arthritis by obtaining rheumatoid factor, CCP, sed rate, CRP. Would have also recommended physical therapy versus orthopedic referral.  Unfortunately, patient stormed out of the room before an exam and further workup could be  obtained.  Our phlebotomist was unable to obtain any blood.   No apparent injury was noted to the patient or phlebotomist.   Restless legs Assessment & Plan: Exam today and limited. No alarm signs in HPI.  Would have recommended gabapentin 100 to 300 mg HS for treatment; however, he became upset during his lab draw and stormed out of the exam room.     Chronic  pain of right upper extremity Assessment & Plan: Exam limited as patient abruptly discontinued the visit; however he was able to demonstrate normal range of motion to right shoulder during HPI.  Differentials include tendinitis, gout, rheumatoid arthritis, osteoarthritis. Unfortunately, labs were unable to be obtained as he became upset and resisted continued blood draw.  Would recommend lab workup including uric acid, CCP, RF, sed rate, CRP, CBC.  Would also recommend sports medicine evaluation for suspected chronic tendinitis to the elbow; especially given his line of work.    45 minutes were spent face-to-face with patient, reviewing records, and documenting.  See information and assessment and plan.     Doreene Nest, NP

## 2023-01-23 ENCOUNTER — Ambulatory Visit: Payer: Medicare PPO | Admitting: Nurse Practitioner

## 2023-05-28 ENCOUNTER — Emergency Department
Admission: EM | Admit: 2023-05-28 | Discharge: 2023-05-28 | Disposition: A | Discharge status: Home / Self Care | Payer: Self-pay | Attending: Emergency Medicine | Admitting: Emergency Medicine

## 2023-05-28 ENCOUNTER — Other Ambulatory Visit: Payer: Self-pay

## 2023-05-28 DIAGNOSIS — R338 Other retention of urine: Secondary | ICD-10-CM

## 2023-05-28 DIAGNOSIS — R339 Retention of urine, unspecified: Secondary | ICD-10-CM | POA: Insufficient documentation

## 2023-05-28 DIAGNOSIS — N401 Enlarged prostate with lower urinary tract symptoms: Secondary | ICD-10-CM | POA: Insufficient documentation

## 2023-05-28 LAB — URINALYSIS, ROUTINE W REFLEX MICROSCOPIC
Bilirubin Urine: NEGATIVE
Glucose, UA: NEGATIVE mg/dL
Hgb urine dipstick: NEGATIVE
Ketones, ur: NEGATIVE mg/dL
Leukocytes,Ua: NEGATIVE
Nitrite: NEGATIVE
Protein, ur: NEGATIVE mg/dL
Specific Gravity, Urine: 1.015 (ref 1.005–1.030)
pH: 5 (ref 5.0–8.0)

## 2023-05-28 NOTE — ED Provider Notes (Signed)
[facility] Emergency Department Provider Note     Event Date/Time   First MD Initiated Contact with Patient 05/28/23 1927     (approximate)   History   Urinary Retention   HPI  Brian Vaughan is a 68 y.o. male with a history of HLD, GERD, and BPH, presents to the ED for inability to void over the last 2 days.  Patient report he has had inability to completely empty his bladder last 2 days.  He would be able to pass some urine sporadically, limiting the pressure on his bladder, but soon experienced extreme pressure, pain in bladder region.  He denies any gross hematuria, penile lesions, penile trauma, or fluid overload.  Patient would report symptoms of BPH including slow voiding, dribbling, and incomplete emptying.  He is not in the past been evaluated by urology.  Foley catheter was placed in triage with significant improvement of his symptoms.  Physical Exam   Triage Vital Signs: ED Triage Vitals  Encounter Vitals Group     BP 05/28/23 1816 130/73     Systolic BP Percentile --      Diastolic BP Percentile --      Pulse Rate 05/28/23 1816 66     Resp 05/28/23 1816 18     Temp 05/28/23 1816 98.4 F (36.9 C)     Temp Source 05/28/23 1816 Oral     SpO2 05/28/23 1816 98 %     Weight 05/28/23 1805 210 lb (95.3 kg)     Height 05/28/23 1805 6\' 3"  (1.905 m)     Head Circumference --      Peak Flow --      Pain Score --      Pain Loc --      Pain Education --      Exclude from Growth Chart --     Most recent vital signs: Vitals:   05/28/23 1816 05/28/23 2145  BP: 130/73 138/77  Pulse: 66 73  Resp: 18 18  Temp: 98.4 F (36.9 C)   SpO2: 98% 99%    General Awake, no distress. NAD HEENT NCAT. PERRL. EOMI. No rhinorrhea. Mucous membranes are moist.  CV:  Good peripheral perfusion.  RESP:  Normal effort.   ABD:  No distention.  Soft and nontender GU:  Deferred. Foley catheter in place   ED Results / Procedures / Treatments   Labs (all  labs ordered are listed, but only abnormal results are displayed) Labs Reviewed  URINALYSIS, ROUTINE W REFLEX MICROSCOPIC - Abnormal; Notable for the following components:      Result Value   Color, Urine YELLOW (*)    APPearance CLEAR (*)    All other components within normal limits    EKG   RADIOLOGY  No results found.   PROCEDURES:  Critical Care performed: No  Procedures   MEDICATIONS ORDERED IN ED: Medications - No data to display   IMPRESSION / MDM / ASSESSMENT AND PLAN / ED COURSE  I reviewed the triage vital signs and the nursing notes.                              Differential diagnosis includes, but is not limited to, BPH, urinary outlet obstruction, fluid overload, diuresis  Patient's presentation is most consistent with acute complicated illness / injury requiring diagnostic workup.  Patient's diagnosis is consistent with acute urinary retention secondary to BPH.  Patient with significant  improvement of her symptoms after 16 French Foley catheter was placed in triage.  Since that time he has been able to express clear yellow urine without difficulty.  Patient denies any ongoing complaints at this time.  I advised that the patient should keep the Foley in place until he is evaluated by urology for symptoms that seem consistent with BPH.  Patient is UA is negative at this time without evidence of acute hematuria or bacteriuria.  Patient is to follow up with urology as discussed, as needed or otherwise directed. Patient is given ED precautions to return to the ED for any worsening or new symptoms.     FINAL CLINICAL IMPRESSION(S) / ED DIAGNOSES   Final diagnoses:  Acute urinary retention  Benign prostatic hyperplasia with urinary retention     Rx / DC Orders   ED Discharge Orders     None        Note:  This document was prepared using Dragon voice recognition software and may include unintentional dictation errors.    Lissa Hoard,  PA-C 05/29/23 0016    Dionne Bucy, MD 05/31/23 [phone]

## 2023-05-28 NOTE — Discharge Instructions (Addendum)
Your exam and urinalysis are reassuring. Your symptoms are consistent with urinary retention due to an enlarged prostate. You should wear the foley catheter until you are evaluated by Urology.

## 2023-05-28 NOTE — ED Triage Notes (Signed)
Pt came to the ED with the inability to void for the last two day. Pt holding himself while dancing in triage. Pt unable to cooperate due to the amount of pain he was in. RN placed 77fr coude foley while in triage as RN was unable to bladder scan pt due to the machine not picking up the amount.

## 2023-06-02 ENCOUNTER — Telehealth: Payer: Self-pay

## 2023-06-02 NOTE — Progress Notes (Signed)
Transition Care Management Unsuccessful Follow-up Telephone Call  Date of discharge and from where:  [facility] 1/19  Attempts:  1st Attempt  Reason for unsuccessful TCM follow-up call:  No answer/busy   Brian Vaughan  [facility] Guide, Phone: [phone] Fax: [phone] Website: [email or web address]

## 2023-06-05 ENCOUNTER — Telehealth: Payer: Self-pay

## 2023-06-05 NOTE — Progress Notes (Signed)
Transition Care Management Unsuccessful Follow-up Telephone Call  Date of discharge and from where:  [facility] 1/19  Attempts:  2nd Attempt  Reason for unsuccessful TCM follow-up call:  No answer/busy   Lenard Forth [facility] Guide, Phone: [phone] Fax: [phone] Website: [email or web address]

## 2023-10-04 ENCOUNTER — Encounter: Payer: Self-pay | Admitting: Emergency Medicine

## 2023-10-04 ENCOUNTER — Other Ambulatory Visit: Payer: Self-pay

## 2023-10-04 ENCOUNTER — Emergency Department
Admission: EM | Admit: 2023-10-04 | Discharge: 2023-10-04 | Disposition: A | Discharge status: Home / Self Care | Attending: Emergency Medicine | Admitting: Emergency Medicine

## 2023-10-04 DIAGNOSIS — X58XXXD Exposure to other specified factors, subsequent encounter: Secondary | ICD-10-CM | POA: Insufficient documentation

## 2023-10-04 DIAGNOSIS — S41101D Unspecified open wound of right upper arm, subsequent encounter: Secondary | ICD-10-CM | POA: Insufficient documentation

## 2023-10-04 DIAGNOSIS — S41101A Unspecified open wound of right upper arm, initial encounter: Secondary | ICD-10-CM

## 2023-10-04 DIAGNOSIS — Z4800 Encounter for change or removal of nonsurgical wound dressing: Secondary | ICD-10-CM | POA: Insufficient documentation

## 2023-10-04 DIAGNOSIS — Z4801 Encounter for change or removal of surgical wound dressing: Secondary | ICD-10-CM | POA: Diagnosis not present

## 2023-10-04 LAB — COMPREHENSIVE METABOLIC PANEL WITH GFR
ALT: 11 U/L (ref 0–44)
AST: 14 U/L — ABNORMAL LOW (ref 15–41)
Albumin: 4.2 g/dL (ref 3.5–5.0)
Alkaline Phosphatase: 36 U/L — ABNORMAL LOW (ref 38–126)
Anion gap: 6 (ref 5–15)
BUN: 17 mg/dL (ref 8–23)
CO2: 31 mmol/L (ref 22–32)
Calcium: 9.5 mg/dL (ref 8.9–10.3)
Chloride: 101 mmol/L (ref 98–111)
Creatinine, Ser: 1.59 mg/dL — ABNORMAL HIGH (ref 0.61–1.24)
GFR, Estimated: 48 mL/min — ABNORMAL LOW (ref >60)
Glucose, Bld: 104 mg/dL — ABNORMAL HIGH (ref 70–99)
Potassium: 4.1 mmol/L (ref 3.5–5.1)
Sodium: 138 mmol/L (ref 135–145)
Total Bilirubin: 0.4 mg/dL (ref 0.0–1.2)
Total Protein: 7.6 g/dL (ref 6.5–8.1)

## 2023-10-04 LAB — CBC WITH DIFFERENTIAL/PLATELET
Abs Immature Granulocytes: 0.01 10*3/uL (ref 0.00–0.07)
Basophils Absolute: 0.1 10*3/uL (ref 0.0–0.1)
Basophils Relative: 1 %
Eosinophils Absolute: 0.3 10*3/uL (ref 0.0–0.5)
Eosinophils Relative: 5 %
HCT: 42 % (ref 39.0–52.0)
Hemoglobin: 13.7 g/dL (ref 13.0–17.0)
Immature Granulocytes: 0 %
Lymphocytes Relative: 31 %
Lymphs Abs: 1.8 10*3/uL (ref 0.7–4.0)
MCH: 28.9 pg (ref 26.0–34.0)
MCHC: 32.6 g/dL (ref 30.0–36.0)
MCV: 88.6 fL (ref 80.0–100.0)
Monocytes Absolute: 0.4 10*3/uL (ref 0.1–1.0)
Monocytes Relative: 7 %
Neutro Abs: 3.3 10*3/uL (ref 1.7–7.7)
Neutrophils Relative %: 56 %
Platelets: 232 10*3/uL (ref 150–400)
RBC: 4.74 MIL/uL (ref 4.22–5.81)
RDW: 13.2 % (ref 11.5–15.5)
WBC: 5.9 10*3/uL (ref 4.0–10.5)
nRBC: 0 % (ref 0.0–0.2)

## 2023-10-04 LAB — LACTIC ACID, PLASMA: Lactic Acid, Venous: 1.3 mmol/L (ref 0.5–1.9)

## 2023-10-04 NOTE — ED Provider Notes (Signed)
 [facility] Provider Note    Event Date/Time   First MD Initiated Contact with Patient 10/04/23 1316     (approximate)   History   Wound Check   HPI  Brian Vaughan is a 68 y.o. male who presents with complaints of right elbow wound that he sustained 1 month ago.  Patient apparently suffered abrasion/skin tear to the right elbow, did not seek care at that time.  Skin has rolled up and abrasion is not fully healed although no signs of infection     Physical Exam   Triage Vital Signs: ED Triage Vitals [10/04/23 1301]  Encounter Vitals Group     BP 136/85     Systolic BP Percentile      Diastolic BP Percentile      Pulse Rate (!) 53     Resp 17     Temp 97.9 F (36.6 C)     Temp Source Oral     SpO2 100 %     Weight 98.4 kg (217 lb)     Height 1.905 m (6\' 3" )     Head Circumference      Peak Flow      Pain Score 5     Pain Loc      Pain Education      Exclude from Growth Chart     Most recent vital signs: Vitals:   10/04/23 1301  BP: 136/85  Pulse: (!) 53  Resp: 17  Temp: 97.9 F (36.6 C)  SpO2: 100%     General: Awake, no distress.  CV:  Good peripheral perfusion.  Resp:  Normal effort.  Abd:  No distention.  Other:  Right elbow: Normal range of motion, no joint swelling or painful range of motion.  Healing abrasion, no signs of cellulitis, skin from skin tear has likely rolled up against his elbow, this is the primary reason is here he would like this surgically removed.   ED Results / Procedures / Treatments   Labs (all labs ordered are listed, but only abnormal results are displayed) Labs Reviewed  COMPREHENSIVE METABOLIC PANEL WITH GFR - Abnormal; Notable for the following components:      Result Value   Glucose, Bld 104 (*)    Creatinine, Ser 1.59 (*)    AST 14 (*)    Alkaline Phosphatase 36 (*)    GFR, Estimated 48 (*)    All other components within normal limits  LACTIC ACID, PLASMA  CBC WITH  DIFFERENTIAL/PLATELET  LACTIC ACID, PLASMA     EKG     RADIOLOGY     PROCEDURES:  Critical Care performed:   Procedures   MEDICATIONS ORDERED IN ED: Medications - No data to display   IMPRESSION / MDM / ASSESSMENT AND PLAN / ED COURSE  I reviewed the triage vital signs and the nursing notes. Patient's presentation is most consistent with acute complicated illness / injury requiring diagnostic workup.  Patient presents for nonhealing wounds, likely this is due to the location of the wound and the fact that initially seek care, skin is now retracted, abrasion seems to be healing, signs of granulation tissue, no signs of infection.  No concerns for joint infection  Discussed with him that he will need to follow-up with likely plastic surgery for cosmetic repair lab work reviewed and is unremarkable        FINAL CLINICAL IMPRESSION(S) / ED DIAGNOSES   Final diagnoses:  Non-healing wound of upper extremity, right, initial  encounter     Rx / DC Orders   ED Discharge Orders     None        Note:  This document was prepared using Dragon voice recognition software and may include unintentional dictation errors.   Bryson Carbine, MD 10/04/23 [phone]

## 2023-10-04 NOTE — ED Triage Notes (Signed)
 Patient to ED via POV for wound check to right elbow. Pt reports cutting elbow on glass a couple of months ago. States he was never seen for same and not healing. States yellow color drainage. Denies fevers at home.

## 2023-11-24 DIAGNOSIS — F3132 Bipolar disorder, current episode depressed, moderate: Secondary | ICD-10-CM | POA: Diagnosis not present

## 2023-11-24 DIAGNOSIS — F5105 Insomnia due to other mental disorder: Secondary | ICD-10-CM | POA: Diagnosis not present

## 2023-11-24 DIAGNOSIS — F902 Attention-deficit hyperactivity disorder, combined type: Secondary | ICD-10-CM | POA: Diagnosis not present

## 2023-11-24 DIAGNOSIS — F4312 Post-traumatic stress disorder, chronic: Secondary | ICD-10-CM | POA: Diagnosis not present

## 2023-12-22 DIAGNOSIS — F902 Attention-deficit hyperactivity disorder, combined type: Secondary | ICD-10-CM | POA: Diagnosis not present

## 2023-12-22 DIAGNOSIS — Z79899 Other long term (current) drug therapy: Secondary | ICD-10-CM | POA: Diagnosis not present

## 2023-12-22 DIAGNOSIS — F5105 Insomnia due to other mental disorder: Secondary | ICD-10-CM | POA: Diagnosis not present

## 2023-12-22 DIAGNOSIS — F4312 Post-traumatic stress disorder, chronic: Secondary | ICD-10-CM | POA: Diagnosis not present

## 2023-12-22 DIAGNOSIS — F3132 Bipolar disorder, current episode depressed, moderate: Secondary | ICD-10-CM | POA: Diagnosis not present

## 2024-02-15 DIAGNOSIS — H25813 Combined forms of age-related cataract, bilateral: Secondary | ICD-10-CM | POA: Diagnosis not present

## 2024-02-15 DIAGNOSIS — H5213 Myopia, bilateral: Secondary | ICD-10-CM | POA: Diagnosis not present

## 2024-02-23 DIAGNOSIS — F3132 Bipolar disorder, current episode depressed, moderate: Secondary | ICD-10-CM | POA: Diagnosis not present

## 2024-02-23 DIAGNOSIS — F4312 Post-traumatic stress disorder, chronic: Secondary | ICD-10-CM | POA: Diagnosis not present

## 2024-02-23 DIAGNOSIS — F902 Attention-deficit hyperactivity disorder, combined type: Secondary | ICD-10-CM | POA: Diagnosis not present

## 2024-02-23 DIAGNOSIS — F5105 Insomnia due to other mental disorder: Secondary | ICD-10-CM | POA: Diagnosis not present
